# Patient Record
Sex: Female | Born: 1980 | Race: White | Hispanic: No | Marital: Married | Smoking: Never smoker
Health system: Southern US, Community
[De-identification: ages and names within clinical notes are randomized; demographics above are authoritative.]

## PROBLEM LIST (undated history)

## (undated) DIAGNOSIS — O09299 Supervision of pregnancy with other poor reproductive or obstetric history, unspecified trimester: Principal | ICD-10-CM

## (undated) DIAGNOSIS — O0900 Supervision of pregnancy with history of infertility, unspecified trimester: Secondary | ICD-10-CM

## (undated) DIAGNOSIS — R51 Headache: Secondary | ICD-10-CM

## (undated) HISTORY — DX: Headache: R51

## (undated) HISTORY — PX: WISDOM TOOTH EXTRACTION: SHX21

## (undated) HISTORY — DX: Supervision of pregnancy with other poor reproductive or obstetric history, unspecified trimester: O09.299

## (undated) HISTORY — PX: DILATION AND CURETTAGE OF UTERUS: SHX78

## (undated) HISTORY — DX: Supervision of pregnancy with history of infertility, unspecified trimester: O09.00

---

## 2008-11-30 ENCOUNTER — Inpatient Hospital Stay (HOSPITAL_COMMUNITY): Admission: AD | Admit: 2008-11-30 | Discharge: 2008-11-30 | Payer: Self-pay | Admitting: Obstetrics and Gynecology

## 2008-12-01 ENCOUNTER — Ambulatory Visit (HOSPITAL_COMMUNITY): Admission: AD | Admit: 2008-12-01 | Discharge: 2008-12-01 | Payer: Self-pay | Admitting: Obstetrics and Gynecology

## 2009-02-11 ENCOUNTER — Inpatient Hospital Stay (HOSPITAL_COMMUNITY): Admission: AD | Admit: 2009-02-11 | Discharge: 2009-02-13 | Payer: Self-pay | Admitting: Obstetrics and Gynecology

## 2010-07-05 LAB — CBC
HCT: 37.9 % (ref 36.0–46.0)
Hemoglobin: 12.7 g/dL (ref 12.0–15.0)
MCHC: 33.7 g/dL (ref 30.0–36.0)
MCV: 90.9 fL (ref 78.0–100.0)
MCV: 90.9 fL (ref 78.0–100.0)
RBC: 3.9 MIL/uL (ref 3.87–5.11)
RDW: 12.9 % (ref 11.5–15.5)
RDW: 13.3 % (ref 11.5–15.5)

## 2010-07-08 LAB — URINALYSIS, ROUTINE W REFLEX MICROSCOPIC
Glucose, UA: NEGATIVE mg/dL
Hgb urine dipstick: NEGATIVE
Ketones, ur: NEGATIVE mg/dL
Protein, ur: NEGATIVE mg/dL
Urobilinogen, UA: 0.2 mg/dL (ref 0.0–1.0)

## 2011-04-03 NOTE — L&D Delivery Note (Signed)
Delivery Note  I was called to BS secondary of pt w urge to push, she was complete +2 at 1045, pt pushed well over 5-6 ctx   At 11:07 AM a viable female was delivered via Vaginal, Spontaneous Delivery (Presentation: Right Occiput Anterior).  Easy delivery of shoulders, infant w lusty cry and placed on mom's abdomen, cord was clamped and then cut by FOB APGAR: 9, 9; weight 9 lb 15.6 oz (4525 g).   Placenta status: Intact, Spontaneous.  Cord: 3 vessels with the following complications: None.  Cord pH: n/a  Anesthesia: 1% lidocaine   Episiotomy: None Lacerations: 1st degree; R Labial 1 stitch 4-0 monocryl for cosmetic purposes  Suture Repair: 3.0 vicryl rapide Est. Blood Loss (mL): 250  Mom to postpartum.  Baby to nursery-stable. Infant remains skin-skin w pt Pt plans to BF Declines any contraception at present (previous child conceived w clomid)  Dr Normand Sloop notified   Sheila Schwartz 01/16/2012 at 12pm

## 2011-06-21 ENCOUNTER — Encounter (INDEPENDENT_AMBULATORY_CARE_PROVIDER_SITE_OTHER): Payer: BC Managed Care – PPO

## 2011-06-21 DIAGNOSIS — O26849 Uterine size-date discrepancy, unspecified trimester: Secondary | ICD-10-CM

## 2011-06-21 LAB — RPR
RPR: NONREACTIVE
RPR: NONREACTIVE

## 2011-06-21 LAB — CBC
HCT: 39 % (ref 36–46)
HCT: 39 % (ref 36–46)
Hemoglobin: 12.7 g/dL (ref 12.0–16.0)
Platelets: 265 10*3/uL (ref 150–399)
Platelets: 265 10*3/uL (ref 150–399)

## 2011-06-21 LAB — ABO/RH

## 2011-06-21 LAB — RUBELLA ANTIBODY, IGM: Rubella: IMMUNE

## 2011-07-16 ENCOUNTER — Encounter: Payer: Self-pay | Admitting: Obstetrics and Gynecology

## 2011-07-16 ENCOUNTER — Ambulatory Visit (INDEPENDENT_AMBULATORY_CARE_PROVIDER_SITE_OTHER): Payer: BC Managed Care – PPO | Admitting: Obstetrics and Gynecology

## 2011-07-16 VITALS — BP 110/60 | Ht 73.0 in | Wt 200.0 lb

## 2011-07-16 DIAGNOSIS — Z124 Encounter for screening for malignant neoplasm of cervix: Secondary | ICD-10-CM

## 2011-07-16 DIAGNOSIS — Z331 Pregnant state, incidental: Secondary | ICD-10-CM

## 2011-07-16 LAB — OB RESULTS CONSOLE GC/CHLAMYDIA
Chlamydia: NEGATIVE
Gonorrhea: NEGATIVE

## 2011-07-16 NOTE — Progress Notes (Signed)
Pt c/o cramping. 

## 2011-07-16 NOTE — Patient Instructions (Signed)
ABCs of Pregnancy A Antepartum care is very important. Be sure you see your doctor and get prenatal care as soon as you think you are pregnant. At this time, you will be tested for infection, genetic abnormalities and potential problems with you and the pregnancy. This is the time to discuss diet, exercise, work, medications, labor, pain medication during labor and the possibility of a cesarean delivery. Ask any questions that may concern you. It is important to see your doctor regularly throughout your pregnancy. Avoid exposure to toxic substances and chemicals - such as cleaning solvents, lead and mercury, some insecticides, and paint. Pregnant women should avoid exposure to paint fumes, and fumes that cause you to feel ill, dizzy or faint. When possible, it is a good idea to have a pre-pregnancy consultation with your caregiver to begin some important recommendations your caregiver suggests such as, taking folic acid, exercising, quitting smoking, avoiding alcoholic beverages, etc. B Breastfeeding is the healthiest choice for both you and your baby. It has many nutritional benefits for the baby and health benefits for the mother. It also creates a very tight and loving bond between the baby and mother. Talk to your doctor, your family and friends, and your employer about how you choose to feed your baby and how they can support you in your decision. Not all birth defects can be prevented, but a woman can take actions that may increase her chance of having a healthy baby. Many birth defects happen very early in pregnancy, sometimes before a woman even knows she is pregnant. Birth defects or abnormalities of any child in your or the father's family should be discussed with your caregiver. Get a good support bra as your breast size changes. Wear it especially when you exercise and when nursing.  C Celebrate the news of your pregnancy with the your spouse/father and family. Childbirth classes are helpful to  take for you and the spouse/father because it helps to understand what happens during the pregnancy, labor and delivery. Cesarean delivery should be discussed with your doctor so you are prepared for that possibility. The pros and cons of circumcision if it is a boy, should be discussed with your pediatrician. Cigarette smoking during pregnancy can result in low birth weight babies. It has been associated with infertility, miscarriages, tubal pregnancies, infant death (mortality) and poor health (morbidity) in childhood. Additionally, cigarette smoking may cause long-term learning disabilities. If you smoke, you should try to quit before getting pregnant and not smoke during the pregnancy. Secondary smoke may also harm a mother and her developing baby. It is a good idea to ask people to stop smoking around you during your pregnancy and after the baby is born. Extra calcium is necessary when you are pregnant and is found in your prenatal vitamin, in dairy products, green leafy vegetables and in calcium supplements. D A healthy diet according to your current weight and height, along with vitamins and mineral supplements should be discussed with your caregiver. Domestic abuse or violence should be made known to your doctor right away to get the situation corrected. Drink more water when you exercise to keep hydrated. Discomfort of your back and legs usually develops and progresses from the middle of the second trimester through to delivery of the baby. This is because of the enlarging baby and uterus, which may also affect your balance. Do not take illegal drugs. Illegal drugs can seriously harm the baby and you. Drink extra fluids (water is best) throughout pregnancy to help   your body keep up with the increases in your blood volume. Drink at least 6 to 8 glasses of water, fruit juice, or milk each day. A good way to know you are drinking enough fluid is when your urine looks almost like clear water or is very light  yellow.  E Eat healthy to get the nutrients you and your unborn baby need. Your meals should include the five basic food groups. Exercise (30 minutes of light to moderate exercise a day) is important and encouraged during pregnancy, if there are no medical problems or problems with the pregnancy. Exercise that causes discomfort or dizziness should be stopped and reported to your caregiver. Emotions during pregnancy can change from being ecstatic to depression and should be understood by you, your partner and your family. F Fetal screening with ultrasound, amniocentesis and monitoring during pregnancy and labor is common and sometimes necessary. Take 400 micrograms of folic acid daily both before, when possible, and during the first few months of pregnancy to reduce the risk of birth defects of the brain and spine. All women who could possibly become pregnant should take a vitamin with folic acid, every day. It is also important to eat a healthy diet with fortified foods (enriched grain products, including cereals, rice, breads, and pastas) and foods with natural sources of folate (orange juice, green leafy vegetables, beans, peanuts, broccoli, asparagus, peas, and lentils). The father should be involved with all aspects of the pregnancy including, the prenatal care, childbirth classes, labor, delivery, and postpartum time. Fathers may also have emotional concerns about being a father, financial needs, and raising a family. G Genetic testing should be done appropriately. It is important to know your family and the father's history. If there have been problems with pregnancies or birth defects in your family, report these to your doctor. Also, genetic counselors can talk with you about the information you might need in making decisions about having a family. You can call a major medical center in your area for help in finding a board-certified genetic counselor. Genetic testing and counseling should be done  before pregnancy when possible, especially if there is a history of problems in the mother's or father's family. Certain ethnic backgrounds are more at risk for genetic defects. H Get familiar with the hospital where you will be having your baby. Get to know how long it takes to get there, the labor and delivery area, and the hospital procedures. Be sure your medical insurance is accepted there. Get your home ready for the baby including, clothes, the baby's room (when possible), furniture and car seat. Hand washing is important throughout the day, especially after handling raw meat and poultry, changing the baby's diaper or using the bathroom. This can help prevent the spread of many bacteria and viruses that cause infection. Your hair may become dry and thinner, but will return to normal a few weeks after the baby is born. Heartburn is a common problem that can be treated by taking antacids recommended by your caregiver, eating smaller meals 5 or 6 times a day, not drinking liquids when eating, drinking between meals and raising the head of your bed 2 to 3 inches. I Insurance to cover you, the baby, doctor and hospital should be reviewed so that you will be prepared to pay any costs not covered by your insurance plan. If you do not have medical insurance, there are usually clinics and services available for you in your community. Take 30 milligrams of iron during   your pregnancy as prescribed by your doctor to reduce the risk of low red blood cells (anemia) later in pregnancy. All women of childbearing age should eat a diet rich in iron. J There should be a joint effort for the mother, father and any other children to adapt to the pregnancy financially, emotionally, and psychologically during the pregnancy. Join a support group for moms-to-be. Or, join a class on parenting or childbirth. Have the family participate when possible. K Know your limits. Let your caregiver know if you experience any of the  following:   Pain of any kind.   Strong cramps.   You develop a lot of weight in a short period of time (5 pounds in 3 to 5 days).   Vaginal bleeding, leaking of amniotic fluid.   Headache, vision problems.   Dizziness, fainting, shortness of breath.   Chest pain.   Fever of 102 F (38.9 C) or higher.   Gush of clear fluid from your vagina.   Painful urination.   Domestic violence.   Irregular heartbeat (palpitations).   Rapid beating of the heart (tachycardia).   Constant feeling sick to your stomach (nauseous) and vomiting.   Trouble walking, fluid retention (edema).   Muscle weakness.   If your baby has decreased activity.   Persistent diarrhea.   Abnormal vaginal discharge.   Uterine contractions at 20-minute intervals.   Back pain that travels down your leg.  L Learn and practice that what you eat and drink should be in moderation and healthy for you and your baby. Legal drugs such as alcohol and caffeine are important issues for pregnant women. There is no safe amount of alcohol a woman can drink while pregnant. Fetal alcohol syndrome, a disorder characterized by growth retardation, facial abnormalities, and central nervous system dysfunction, is caused by a woman's use of alcohol during pregnancy. Caffeine, found in tea, coffee, soft drinks and chocolate, should also be limited. Be sure to read labels when trying to cut down on caffeine during pregnancy. More than 200 foods, beverages, and over-the-counter medications contain caffeine and have a high salt content! There are coffees and teas that do not contain caffeine. M Medical conditions such as diabetes, epilepsy, and high blood pressure should be treated and kept under control before pregnancy when possible, but especially during pregnancy. Ask your caregiver about any medications that may need to be changed or adjusted during pregnancy. If you are currently taking any medications, ask your caregiver if it  is safe to take them while you are pregnant or before getting pregnant when possible. Also, be sure to discuss any herbs or vitamins you are taking. They are medicines, too! Discuss with your doctor all medications, prescribed and over-the-counter, that you are taking. During your prenatal visit, discuss the medications your doctor may give you during labor and delivery. N Never be afraid to ask your doctor or caregiver questions about your health, the progress of the pregnancy, family problems, stressful situations, and recommendation for a pediatrician, if you do not have one. It is better to take all precautions and discuss any questions or concerns you may have during your office visits. It is a good idea to write down your questions before you visit the doctor. O Over-the-counter cough and cold remedies may contain alcohol or other ingredients that should be avoided during pregnancy. Ask your caregiver about prescription, herbs or over-the-counter medications that you are taking or may consider taking while pregnant.  P Physical activity during pregnancy can   benefit both you and your baby by lessening discomfort and fatigue, providing a sense of well-being, and increasing the likelihood of early recovery after delivery. Light to moderate exercise during pregnancy strengthens the belly (abdominal) and back muscles. This helps improve posture. Practicing yoga, walking, swimming, and cycling on a stationary bicycle are usually safe exercises for pregnant women. Avoid scuba diving, exercise at high altitudes (over 3000 feet), skiing, horseback riding, contact sports, etc. Always check with your doctor before beginning any kind of exercise, especially during pregnancy and especially if you did not exercise before getting pregnant. Q Queasiness, stomach upset and morning sickness are common during pregnancy. Eating a couple of crackers or dry toast before getting out of bed. Foods that you normally love may  make you feel sick to your stomach. You may need to substitute other nutritious foods. Eating 5 or 6 small meals a day instead of 3 large ones may make you feel better. Do not drink with your meals, drink between meals. Questions that you have should be written down and asked during your prenatal visits. R Read about and make plans to baby-proof your home. There are important tips for making your home a safer environment for your baby. Review the tips and make your home safer for you and your baby. Read food labels regarding calories, salt and fat content in the food. S Saunas, hot tubs, and steam rooms should be avoided while you are pregnant. Excessive high heat may be harmful during your pregnancy. Your caregiver will screen and examine you for sexually transmitted diseases and genetic disorders during your prenatal visits. Learn the signs of labor. Sexual relations while pregnant is safe unless there is a medical or pregnancy problem and your caregiver advises against it. T Traveling long distances should be avoided especially in the third trimester of your pregnancy. If you do have to travel out of state, be sure to take a copy of your medical records and medical insurance plan with you. You should not travel long distances without seeing your doctor first. Most airlines will not allow you to travel after 36 weeks of pregnancy. Toxoplasmosis is an infection caused by a parasite that can seriously harm an unborn baby. Avoid eating undercooked meat and handling cat litter. Be sure to wear gloves when gardening. Tingling of the hands and fingers is not unusual and is due to fluid retention. This will go away after the baby is born. U Womb (uterus) size increases during the first trimester. Your kidneys will begin to function more efficiently. This may cause you to feel the need to urinate more often. You may also leak urine when sneezing, coughing or laughing. This is due to the growing uterus pressing  against your bladder, which lies directly in front of and slightly under the uterus during the first few months of pregnancy. If you experience burning along with frequency of urination or bloody urine, be sure to tell your doctor. The size of your uterus in the third trimester may cause a problem with your balance. It is advisable to maintain good posture and avoid wearing high heels during this time. An ultrasound of your baby may be necessary during your pregnancy and is safe for you and your baby. V Vaccinations are an important concern for pregnant women. Get needed vaccines before pregnancy. Center for Disease Control (www.cdc.gov) has clear guidelines for the use of vaccines during pregnancy. Review the list, be sure to discuss it with your doctor. Prenatal vitamins are helpful   and healthy for you and the baby. Do not take extra vitamins except what is recommended. Taking too much of certain vitamins can cause overdose problems. Continuous vomiting should be reported to your caregiver. Varicose veins may appear especially if there is a family history of varicose veins. They should subside after the delivery of the baby. Support hose helps if there is leg discomfort. W Being overweight or underweight during pregnancy may cause problems. Try to get within 15 pounds of your ideal weight before pregnancy. Remember, pregnancy is not a time to be dieting! Do not stop eating or start skipping meals as your weight increases. Both you and your baby need the calories and nutrition you receive from a healthy diet. Be sure to consult with your doctor about your diet. There is a formula and diet plan available depending on whether you are overweight or underweight. Your caregiver or nutritionist can help and advise you if necessary. X Avoid X-rays. If you must have dental work or diagnostic tests, tell your dentist or physician that you are pregnant so that extra care can be taken. X-rays should only be taken when  the risks of not taking them outweigh the risk of taking them. If needed, only the minimum amount of radiation should be used. When X-rays are necessary, protective lead shields should be used to cover areas of the body that are not being X-rayed. Y Your baby loves you. Breastfeeding your baby creates a loving and very close bond between the two of you. Give your baby a healthy environment to live in while you are pregnant. Infants and children require constant care and guidance. Their health and safety should be carefully watched at all times. After the baby is born, rest or take a nap when the baby is sleeping. Z Get your ZZZs. Be sure to get plenty of rest. Resting on your side as often as possible, especially on your left side is advised. It provides the best circulation to your baby and helps reduce swelling. Try taking a nap for 30 to 45 minutes in the afternoon when possible. After the baby is born rest or take a nap when the baby is sleeping. Try elevating your feet for that amount of time when possible. It helps the circulation in your legs and helps reduce swelling.  Most information courtesy of the CDC. Document Released: 03/19/2005 Document Revised: 03/08/2011 Document Reviewed: 12/01/2008 [employer] Patient Information 2012 [employer]. 

## 2011-07-16 NOTE — Progress Notes (Signed)
Wet prep ph 4.5 whiff negative. Wet prep negative

## 2011-07-16 NOTE — Progress Notes (Signed)
Pt here for NEW OB work up.  She is without complaint Physical Examination: General appearance - alert, well appearing, and in no distress Neck - supple, no significant adenopathy Chest - clear to auscultation, no wheezes, rales or rhonchi, symmetric air entry Heart - normal rate, regular rhythm, normal S1, S2, no murmurs, rubs, clicks or gallops Abdomen - soft, nontender, nondistended, no masses or organomegaly Breasts - breasts appear normal, no suspicious masses, no skin or nipple changes or axillary nodes Pelvic - CERVIX: normal appearing cervix without discharge or lesions,cervix long and closed, UTERUS: uterus is 12 size, shape, consistency and nontender, ADNEXA: normal adnexa in size, nontender and no masses Back exam - full range of motion, no tenderness, palpable spasm or pain on motion Neurological - alert, oriented, normal speech, no focal findings or movement disorder noted Musculoskeletal - no joint tenderness, deformity or swelling Extremities - peripheral pulses normal, no pedal edema, no clubbing or cyanosis Skin - normal coloration and turgor, no rashes, no suspicious skin lesions noted A/P Problem list reviewed Genetic testing discussed and pt declined All questions were answered Return to the office in four weeks

## 2011-07-18 LAB — PAP IG, CT-NG, RFX HPV ASCU
Chlamydia Probe Amp: NEGATIVE
GC Probe Amp: NEGATIVE

## 2011-08-13 ENCOUNTER — Ambulatory Visit (INDEPENDENT_AMBULATORY_CARE_PROVIDER_SITE_OTHER): Payer: BC Managed Care – PPO | Admitting: Obstetrics and Gynecology

## 2011-08-13 VITALS — BP 102/62 | Wt 202.0 lb

## 2011-08-13 DIAGNOSIS — Z3480 Encounter for supervision of other normal pregnancy, unspecified trimester: Secondary | ICD-10-CM

## 2011-08-13 NOTE — Progress Notes (Signed)
No complaints RTO in 2wks for anatomy scan Questions answered

## 2011-08-28 ENCOUNTER — Ambulatory Visit (INDEPENDENT_AMBULATORY_CARE_PROVIDER_SITE_OTHER): Payer: BC Managed Care – PPO

## 2011-08-28 ENCOUNTER — Other Ambulatory Visit: Payer: Self-pay | Admitting: Obstetrics and Gynecology

## 2011-08-28 ENCOUNTER — Encounter: Payer: Self-pay | Admitting: Obstetrics and Gynecology

## 2011-08-28 ENCOUNTER — Ambulatory Visit (INDEPENDENT_AMBULATORY_CARE_PROVIDER_SITE_OTHER): Payer: BC Managed Care – PPO | Admitting: Obstetrics and Gynecology

## 2011-08-28 VITALS — BP 102/64 | Wt 204.0 lb

## 2011-08-28 DIAGNOSIS — Z1389 Encounter for screening for other disorder: Secondary | ICD-10-CM

## 2011-08-28 DIAGNOSIS — Z331 Pregnant state, incidental: Secondary | ICD-10-CM

## 2011-08-28 LAB — US OB COMP + 14 WK

## 2011-08-28 NOTE — Progress Notes (Signed)
Pt has no concerns today 

## 2011-08-28 NOTE — Progress Notes (Signed)
Doing well. Ultrasound: Single gestation, normal fluid, normal anatomy, female, anterior placenta, cervix 4.22 cm, 20 weeks (35th percentile). Return to office in 4 weeks. Genetic studies previously declined.

## 2011-09-25 ENCOUNTER — Ambulatory Visit (INDEPENDENT_AMBULATORY_CARE_PROVIDER_SITE_OTHER): Payer: BC Managed Care – PPO | Admitting: Obstetrics and Gynecology

## 2011-09-25 VITALS — BP 120/70 | Wt 212.0 lb

## 2011-09-25 DIAGNOSIS — Z8742 Personal history of other diseases of the female genital tract: Secondary | ICD-10-CM

## 2011-09-25 NOTE — Progress Notes (Signed)
Doing well--some pelvic pressure. Glucola, RPR, Hgb NV. Reviewed EDC of 01/21/12 based on 1st trimester [place].

## 2011-10-25 ENCOUNTER — Ambulatory Visit (INDEPENDENT_AMBULATORY_CARE_PROVIDER_SITE_OTHER): Payer: BC Managed Care – PPO | Admitting: Obstetrics and Gynecology

## 2011-10-25 ENCOUNTER — Other Ambulatory Visit: Payer: BC Managed Care – PPO

## 2011-10-25 ENCOUNTER — Encounter: Payer: Self-pay | Admitting: Obstetrics and Gynecology

## 2011-10-25 VITALS — BP 110/60 | Wt 220.0 lb

## 2011-10-25 DIAGNOSIS — Z331 Pregnant state, incidental: Secondary | ICD-10-CM

## 2011-10-25 DIAGNOSIS — Z3490 Encounter for supervision of normal pregnancy, unspecified, unspecified trimester: Secondary | ICD-10-CM

## 2011-10-25 LAB — CBC
Hemoglobin: 11.3 g/dL — ABNORMAL LOW (ref 12.0–15.0)
MCH: 29.5 pg (ref 26.0–34.0)
MCV: 85.9 fL (ref 78.0–100.0)
RBC: 3.83 MIL/uL — ABNORMAL LOW (ref 3.87–5.11)

## 2011-10-25 NOTE — Progress Notes (Signed)
A/P Glucola, hemoglobin and RPR today Fetal kick counts reviewed All patients questions answered Return in two weeks Continue Prenatal vitamins Blood type o positive Pt with pelvic pain. She declined cervical exam.  He states it is unchanged.

## 2011-10-25 NOTE — Patient Instructions (Signed)
Fetal Movement Counts Patient Name: __________________________________________________ Patient Due Date: ____________________ Kick counts is highly recommended in high risk pregnancies, but it is a good idea for every pregnant woman to do. Start counting fetal movements at 28 weeks of the pregnancy. Fetal movements increase after eating a full meal or eating or drinking something sweet (the blood sugar is higher). It is also important to drink plenty of fluids (well hydrated) before doing the count. Lie on your left side because it helps with the circulation or you can sit in a comfortable chair with your arms over your belly (abdomen) with no distractions around you. DOING THE COUNT  Try to do the count the same time of day each time you do it.   Mark the day and time, then see how long it takes for you to feel 10 movements (kicks, flutters, swishes, rolls). You should have at least 10 movements within 2 hours. You will most likely feel 10 movements in much less than 2 hours. If you do not, wait an hour and count again. After a couple of days you will see a pattern.   What you are looking for is a change in the pattern or not enough counts in 2 hours. Is it taking longer in time to reach 10 movements?  SEEK MEDICAL CARE IF:  You feel less than 10 counts in 2 hours. Tried twice.   No movement in one hour.   The pattern is changing or taking longer each day to reach 10 counts in 2 hours.   You feel the baby is not moving as it usually does.  Date: ____________ Movements: ____________ Start time: ____________ Finish time: ____________  Date: ____________ Movements: ____________ Start time: ____________ Finish time: ____________ Date: ____________ Movements: ____________ Start time: ____________ Finish time: ____________ Date: ____________ Movements: ____________ Start time: ____________ Finish time: ____________ Date: ____________ Movements: ____________ Start time: ____________ Finish time:  ____________ Date: ____________ Movements: ____________ Start time: ____________ Finish time: ____________ Date: ____________ Movements: ____________ Start time: ____________ Finish time: ____________ Date: ____________ Movements: ____________ Start time: ____________ Finish time: ____________  Date: ____________ Movements: ____________ Start time: ____________ Finish time: ____________ Date: ____________ Movements: ____________ Start time: ____________ Finish time: ____________ Date: ____________ Movements: ____________ Start time: ____________ Finish time: ____________ Date: ____________ Movements: ____________ Start time: ____________ Finish time: ____________ Date: ____________ Movements: ____________ Start time: ____________ Finish time: ____________ Date: ____________ Movements: ____________ Start time: ____________ Finish time: ____________ Date: ____________ Movements: ____________ Start time: ____________ Finish time: ____________  Date: ____________ Movements: ____________ Start time: ____________ Finish time: ____________ Date: ____________ Movements: ____________ Start time: ____________ Finish time: ____________ Date: ____________ Movements: ____________ Start time: ____________ Finish time: ____________ Date: ____________ Movements: ____________ Start time: ____________ Finish time: ____________ Date: ____________ Movements: ____________ Start time: ____________ Finish time: ____________ Date: ____________ Movements: ____________ Start time: ____________ Finish time: ____________ Date: ____________ Movements: ____________ Start time: ____________ Finish time: ____________  Date: ____________ Movements: ____________ Start time: ____________ Finish time: ____________ Date: ____________ Movements: ____________ Start time: ____________ Finish time: ____________ Date: ____________ Movements: ____________ Start time: ____________ Finish time: ____________ Date: ____________ Movements:  ____________ Start time: ____________ Finish time: ____________ Date: ____________ Movements: ____________ Start time: ____________ Finish time: ____________ Date: ____________ Movements: ____________ Start time: ____________ Finish time: ____________ Date: ____________ Movements: ____________ Start time: ____________ Finish time: ____________  Date: ____________ Movements: ____________ Start time: ____________ Finish time: ____________ Date: ____________ Movements: ____________ Start time: ____________ Finish time: ____________ Date: ____________ Movements: ____________ Start time:   ____________ Finish time: ____________ Date: ____________ Movements: ____________ Start time: ____________ Finish time: ____________ Date: ____________ Movements: ____________ Start time: ____________ Finish time: ____________ Date: ____________ Movements: ____________ Start time: ____________ Finish time: ____________ Date: ____________ Movements: ____________ Start time: ____________ Finish time: ____________  Date: ____________ Movements: ____________ Start time: ____________ Finish time: ____________ Date: ____________ Movements: ____________ Start time: ____________ Finish time: ____________ Date: ____________ Movements: ____________ Start time: ____________ Finish time: ____________ Date: ____________ Movements: ____________ Start time: ____________ Finish time: ____________ Date: ____________ Movements: ____________ Start time: ____________ Finish time: ____________ Date: ____________ Movements: ____________ Start time: ____________ Finish time: ____________ Date: ____________ Movements: ____________ Start time: ____________ Finish time: ____________  Date: ____________ Movements: ____________ Start time: ____________ Finish time: ____________ Date: ____________ Movements: ____________ Start time: ____________ Finish time: ____________ Date: ____________ Movements: ____________ Start time: ____________ Finish  time: ____________ Date: ____________ Movements: ____________ Start time: ____________ Finish time: ____________ Date: ____________ Movements: ____________ Start time: ____________ Finish time: ____________ Date: ____________ Movements: ____________ Start time: ____________ Finish time: ____________ Date: ____________ Movements: ____________ Start time: ____________ Finish time: ____________  Date: ____________ Movements: ____________ Start time: ____________ Finish time: ____________ Date: ____________ Movements: ____________ Start time: ____________ Finish time: ____________ Date: ____________ Movements: ____________ Start time: ____________ Finish time: ____________ Date: ____________ Movements: ____________ Start time: ____________ Finish time: ____________ Date: ____________ Movements: ____________ Start time: ____________ Finish time: ____________ Date: ____________ Movements: ____________ Start time: ____________ Finish time: ____________ Document Released: 04/18/2006 Document Revised: 03/08/2011 Document Reviewed: 10/19/2008 [employer] Patient Information 2012 [employer]. 

## 2011-10-25 NOTE — Progress Notes (Signed)
Per Dillard patient was recommenced to return in 2 weeks from 10/25/2011 for an ob visit.  At check out patient refused to schedule appointment in 2 wks stating that she waited too long at this visit and a previous visit, causing her to be late for work.  Per pt she rather return in 4 weeks instead.

## 2011-10-27 LAB — GLUCOSE TOLERANCE, 1 HOUR (50G) W/O FASTING: Glucose, 1 Hour GTT: 109 mg/dL (ref 70–140)

## 2011-11-23 ENCOUNTER — Ambulatory Visit (INDEPENDENT_AMBULATORY_CARE_PROVIDER_SITE_OTHER): Payer: BC Managed Care – PPO | Admitting: Obstetrics and Gynecology

## 2011-11-23 ENCOUNTER — Encounter: Payer: Self-pay | Admitting: Obstetrics and Gynecology

## 2011-11-23 VITALS — BP 118/70 | Wt 227.0 lb

## 2011-11-23 DIAGNOSIS — Z331 Pregnant state, incidental: Secondary | ICD-10-CM

## 2011-11-23 NOTE — Progress Notes (Signed)
[redacted]w[redacted]d Glucola normal Weight gain of 37 lbs: diet reviewed

## 2011-11-23 NOTE — Progress Notes (Signed)
Pt would like to know baby's positioning.

## 2011-12-07 ENCOUNTER — Ambulatory Visit (INDEPENDENT_AMBULATORY_CARE_PROVIDER_SITE_OTHER): Payer: BC Managed Care – PPO

## 2011-12-07 VITALS — BP 108/68 | Wt 234.0 lb

## 2011-12-07 DIAGNOSIS — O09299 Supervision of pregnancy with other poor reproductive or obstetric history, unspecified trimester: Secondary | ICD-10-CM

## 2011-12-07 NOTE — Progress Notes (Signed)
[redacted]w[redacted]d.  Displeased w/ wait time to be seen. Pt desires Maternity leave to be effective 01/09/12--pt desires note on letterhead to be mailed to her home address to give to her job. No PTL s/s. GFM.  Watch FH.  Size sl >D today; consider u/s 37 weeks for growth secondary to h/o macrosomia.

## 2011-12-07 NOTE — Progress Notes (Signed)
Pt wants to know baby's positioning and wants to talk about when she should start her leave from work.

## 2011-12-09 DIAGNOSIS — O09299 Supervision of pregnancy with other poor reproductive or obstetric history, unspecified trimester: Secondary | ICD-10-CM

## 2011-12-09 HISTORY — DX: Supervision of pregnancy with other poor reproductive or obstetric history, unspecified trimester: O09.299

## 2011-12-21 ENCOUNTER — Ambulatory Visit (INDEPENDENT_AMBULATORY_CARE_PROVIDER_SITE_OTHER): Payer: BC Managed Care – PPO | Admitting: Obstetrics and Gynecology

## 2011-12-21 ENCOUNTER — Encounter: Payer: Self-pay | Admitting: Obstetrics and Gynecology

## 2011-12-21 VITALS — BP 122/60 | Wt 236.0 lb

## 2011-12-21 DIAGNOSIS — N898 Other specified noninflammatory disorders of vagina: Secondary | ICD-10-CM

## 2011-12-21 DIAGNOSIS — B9689 Other specified bacterial agents as the cause of diseases classified elsewhere: Secondary | ICD-10-CM | POA: Insufficient documentation

## 2011-12-21 DIAGNOSIS — B49 Unspecified mycosis: Secondary | ICD-10-CM

## 2011-12-21 DIAGNOSIS — Z3490 Encounter for supervision of normal pregnancy, unspecified, unspecified trimester: Secondary | ICD-10-CM

## 2011-12-21 DIAGNOSIS — O2340 Unspecified infection of urinary tract in pregnancy, unspecified trimester: Secondary | ICD-10-CM

## 2011-12-21 DIAGNOSIS — Z331 Pregnant state, incidental: Secondary | ICD-10-CM

## 2011-12-21 DIAGNOSIS — N760 Acute vaginitis: Secondary | ICD-10-CM

## 2011-12-21 DIAGNOSIS — B379 Candidiasis, unspecified: Secondary | ICD-10-CM | POA: Insufficient documentation

## 2011-12-21 DIAGNOSIS — O2390 Unspecified genitourinary tract infection in pregnancy, unspecified trimester: Secondary | ICD-10-CM

## 2011-12-21 DIAGNOSIS — N390 Urinary tract infection, site not specified: Secondary | ICD-10-CM

## 2011-12-21 DIAGNOSIS — A499 Bacterial infection, unspecified: Secondary | ICD-10-CM

## 2011-12-21 MED ORDER — METRONIDAZOLE 500 MG PO TABS: 500.0000 mg | ORAL_TABLET | Freq: Two times a day (BID) | ORAL | Status: AC

## 2011-12-21 MED ORDER — FLUCONAZOLE 100 MG PO TABS: 100.0000 mg | ORAL_TABLET | Freq: Every day | ORAL | Status: AC

## 2011-12-21 NOTE — Progress Notes (Signed)
[redacted]w[redacted]d C/o Pressure feeling around urethra and sometimes after voiding. Advised that Urine in Neg to dip testing but have sent the specimen to the lab for culture. Speculum examination: Wet Prep: PH 4.0 Yeast and BV noted  To prescribe -  Diflucan 100 mgs  po x 1                           Flagyl 500mg  po  BID x 7 days FM +  FH to dates [name] in 10 days

## 2011-12-21 NOTE — Progress Notes (Signed)
Pt c/o increased vag pressure feels like UTI sx's

## 2011-12-31 ENCOUNTER — Ambulatory Visit (INDEPENDENT_AMBULATORY_CARE_PROVIDER_SITE_OTHER): Payer: BC Managed Care – PPO | Admitting: Obstetrics and Gynecology

## 2011-12-31 ENCOUNTER — Encounter: Payer: Self-pay | Admitting: Obstetrics and Gynecology

## 2011-12-31 VITALS — BP 122/68 | Wt 239.0 lb

## 2011-12-31 DIAGNOSIS — Z3490 Encounter for supervision of normal pregnancy, unspecified, unspecified trimester: Secondary | ICD-10-CM

## 2011-12-31 DIAGNOSIS — N760 Acute vaginitis: Secondary | ICD-10-CM

## 2011-12-31 DIAGNOSIS — Z331 Pregnant state, incidental: Secondary | ICD-10-CM

## 2011-12-31 DIAGNOSIS — B9689 Other specified bacterial agents as the cause of diseases classified elsewhere: Secondary | ICD-10-CM

## 2011-12-31 DIAGNOSIS — A499 Bacterial infection, unspecified: Secondary | ICD-10-CM

## 2011-12-31 LAB — POCT WET PREP (WET MOUNT): Bacteria Wet Prep HPF POC: NEGATIVE

## 2011-12-31 LAB — OB RESULTS CONSOLE GBS: GBS: NEGATIVE

## 2011-12-31 NOTE — Progress Notes (Signed)
[redacted]w[redacted]d Request cervix check.

## 2011-12-31 NOTE — Progress Notes (Signed)
37w 0d Wet prep for TOC for BV: Wet PreP: 4.0,neg. Normal vaginal secretions FM+ SVE: 0.5/ 10/ -3 [name] x 1 week. Needs GBS culture next visit as missed on this visit and not documented anywhere.

## 2012-01-09 ENCOUNTER — Ambulatory Visit (INDEPENDENT_AMBULATORY_CARE_PROVIDER_SITE_OTHER): Payer: BC Managed Care – PPO | Admitting: Obstetrics and Gynecology

## 2012-01-09 ENCOUNTER — Encounter: Payer: Self-pay | Admitting: Obstetrics and Gynecology

## 2012-01-09 VITALS — BP 102/68 | Wt 237.0 lb

## 2012-01-09 DIAGNOSIS — Z331 Pregnant state, incidental: Secondary | ICD-10-CM

## 2012-01-09 NOTE — Progress Notes (Signed)
Patient ID: Sheila Schwartz, female   DOB: September 27, 1980, 31 y.o.   MRN: [id number] [redacted]w[redacted]d  GBS neg Reviewed s/s uc, srom, vag bleeding, daily fetal kick counts to report, comfort measures. Encouragged 8 water daily and frequent voids. Lavera Guise, CNM

## 2012-01-09 NOTE — Progress Notes (Signed)
[redacted]w[redacted]d no complaints.  Declines cervix check.

## 2012-01-15 ENCOUNTER — Encounter: Payer: Self-pay | Admitting: Obstetrics and Gynecology

## 2012-01-15 ENCOUNTER — Ambulatory Visit (INDEPENDENT_AMBULATORY_CARE_PROVIDER_SITE_OTHER): Payer: BC Managed Care – PPO | Admitting: Obstetrics and Gynecology

## 2012-01-15 VITALS — BP 110/74 | Wt 237.0 lb

## 2012-01-15 DIAGNOSIS — Z3480 Encounter for supervision of other normal pregnancy, unspecified trimester: Secondary | ICD-10-CM

## 2012-01-15 NOTE — Progress Notes (Signed)
Doing well. Cervix 3, 80%, vtx, -1. GBS done today, due to no official results in labs (referenced as negative in 10/9 note from [facility], but I see no results in labs).

## 2012-01-15 NOTE — Progress Notes (Signed)
[name] follow up. Pt states a little more pain/ pressure than before, but nothing she doesn't expect. Sheila Schwartz

## 2012-01-16 ENCOUNTER — Encounter (HOSPITAL_COMMUNITY): Payer: Self-pay

## 2012-01-16 ENCOUNTER — Inpatient Hospital Stay (HOSPITAL_COMMUNITY)
Admission: AD | Admit: 2012-01-16 | Discharge: 2012-01-17 | DRG: 373 | Disposition: A | Discharge status: Home / Self Care | Payer: BC Managed Care – PPO | Source: Ambulatory Visit | Attending: Obstetrics and Gynecology | Admitting: Obstetrics and Gynecology

## 2012-01-16 DIAGNOSIS — D649 Anemia, unspecified: Secondary | ICD-10-CM | POA: Diagnosis not present

## 2012-01-16 DIAGNOSIS — O9903 Anemia complicating the puerperium: Secondary | ICD-10-CM | POA: Diagnosis not present

## 2012-01-16 DIAGNOSIS — O09299 Supervision of pregnancy with other poor reproductive or obstetric history, unspecified trimester: Secondary | ICD-10-CM

## 2012-01-16 DIAGNOSIS — Z8742 Personal history of other diseases of the female genital tract: Secondary | ICD-10-CM

## 2012-01-16 LAB — CBC
MCV: 81.7 fL (ref 78.0–100.0)
Platelets: 250 10*3/uL (ref 150–400)
RBC: 4.1 MIL/uL (ref 3.87–5.11)
WBC: 12.8 10*3/uL — ABNORMAL HIGH (ref 4.0–10.5)

## 2012-01-16 LAB — RPR: RPR Ser Ql: NONREACTIVE

## 2012-01-16 LAB — TYPE AND SCREEN: Antibody Screen: NEGATIVE

## 2012-01-16 MED ORDER — LIDOCAINE HCL (PF) 1 % IJ SOLN
30.0000 mL | INTRAMUSCULAR | Status: DC | PRN
  Administered 2012-01-16: 30 mL via SUBCUTANEOUS
  Filled 2012-01-16: qty 30

## 2012-01-16 MED ORDER — LANOLIN HYDROUS EX OINT: TOPICAL_OINTMENT | CUTANEOUS | Status: DC | PRN

## 2012-01-16 MED ORDER — LACTATED RINGERS IV SOLN: 500.0000 mL | INTRAVENOUS | Status: DC | PRN

## 2012-01-16 MED ORDER — ACETAMINOPHEN 500 MG PO TABS: 1000.0000 mg | ORAL_TABLET | ORAL | Status: DC | PRN

## 2012-01-16 MED ORDER — CITRIC ACID-SODIUM CITRATE 334-500 MG/5ML PO SOLN: 30.0000 mL | ORAL | Status: DC | PRN

## 2012-01-16 MED ORDER — SIMETHICONE 80 MG PO CHEW: 80.0000 mg | CHEWABLE_TABLET | ORAL | Status: DC | PRN

## 2012-01-16 MED ORDER — BENZOCAINE-MENTHOL 20-0.5 % EX AERO
1.0000 "application " | INHALATION_SPRAY | CUTANEOUS | Status: DC | PRN
  Filled 2012-01-16 (×2): qty 56

## 2012-01-16 MED ORDER — WITCH HAZEL-GLYCERIN EX PADS: 1.0000 "application " | MEDICATED_PAD | CUTANEOUS | Status: DC | PRN

## 2012-01-16 MED ORDER — OXYCODONE-ACETAMINOPHEN 5-325 MG PO TABS: 1.0000 | ORAL_TABLET | ORAL | Status: DC | PRN

## 2012-01-16 MED ORDER — LACTATED RINGERS IV SOLN: INTRAVENOUS | Status: DC

## 2012-01-16 MED ORDER — FENTANYL CITRATE 0.05 MG/ML IJ SOLN: 100.0000 ug | INTRAMUSCULAR | Status: DC | PRN

## 2012-01-16 MED ORDER — IBUPROFEN 600 MG PO TABS
600.0000 mg | ORAL_TABLET | Freq: Four times a day (QID) | ORAL | Status: DC
  Administered 2012-01-16 – 2012-01-17 (×4): 600 mg via ORAL
  Filled 2012-01-16 (×4): qty 1

## 2012-01-16 MED ORDER — OXYTOCIN BOLUS FROM INFUSION
500.0000 mL | Freq: Once | INTRAVENOUS | Status: DC
  Filled 2012-01-16: qty 500

## 2012-01-16 MED ORDER — IBUPROFEN 600 MG PO TABS
600.0000 mg | ORAL_TABLET | Freq: Four times a day (QID) | ORAL | Status: DC | PRN
  Administered 2012-01-16: 600 mg via ORAL
  Filled 2012-01-16: qty 1

## 2012-01-16 MED ORDER — MEASLES, MUMPS & RUBELLA VAC ~~LOC~~ INJ
0.5000 mL | INJECTION | Freq: Once | SUBCUTANEOUS | Status: DC
  Filled 2012-01-16: qty 0.5

## 2012-01-16 MED ORDER — DIPHENHYDRAMINE HCL 25 MG PO CAPS: 25.0000 mg | ORAL_CAPSULE | Freq: Four times a day (QID) | ORAL | Status: DC | PRN

## 2012-01-16 MED ORDER — ONDANSETRON HCL 4 MG/2ML IJ SOLN: 4.0000 mg | INTRAMUSCULAR | Status: DC | PRN

## 2012-01-16 MED ORDER — ZOLPIDEM TARTRATE 5 MG PO TABS: 5.0000 mg | ORAL_TABLET | Freq: Every evening | ORAL | Status: DC | PRN

## 2012-01-16 MED ORDER — FLEET ENEMA 7-19 GM/118ML RE ENEM: 1.0000 | ENEMA | Freq: Every day | RECTAL | Status: DC | PRN

## 2012-01-16 MED ORDER — SENNOSIDES-DOCUSATE SODIUM 8.6-50 MG PO TABS
2.0000 | ORAL_TABLET | Freq: Every day | ORAL | Status: DC
  Administered 2012-01-16: 2 via ORAL

## 2012-01-16 MED ORDER — TETANUS-DIPHTH-ACELL PERTUSSIS 5-2.5-18.5 LF-MCG/0.5 IM SUSP
0.5000 mL | Freq: Once | INTRAMUSCULAR | Status: AC
  Administered 2012-01-17: 0.5 mL via INTRAMUSCULAR
  Filled 2012-01-16 (×2): qty 0.5

## 2012-01-16 MED ORDER — ONDANSETRON HCL 4 MG PO TABS: 4.0000 mg | ORAL_TABLET | ORAL | Status: DC | PRN

## 2012-01-16 MED ORDER — OXYTOCIN 40 UNITS IN LACTATED RINGERS INFUSION - SIMPLE MED
62.5000 mL/h | Freq: Once | INTRAVENOUS | Status: AC
  Administered 2012-01-16: 62.5 mL/h via INTRAVENOUS
  Filled 2012-01-16: qty 1000

## 2012-01-16 MED ORDER — PRENATAL MULTIVITAMIN CH
1.0000 | ORAL_TABLET | Freq: Every day | ORAL | Status: DC
  Administered 2012-01-17: 1 via ORAL
  Filled 2012-01-16: qty 1

## 2012-01-16 MED ORDER — BISACODYL 10 MG RE SUPP
10.0000 mg | Freq: Every day | RECTAL | Status: DC | PRN
  Filled 2012-01-16: qty 1

## 2012-01-16 MED ORDER — DIBUCAINE 1 % RE OINT
1.0000 "application " | TOPICAL_OINTMENT | RECTAL | Status: DC | PRN
  Filled 2012-01-16: qty 28

## 2012-01-16 MED ORDER — ONDANSETRON HCL 4 MG/2ML IJ SOLN: 4.0000 mg | Freq: Four times a day (QID) | INTRAMUSCULAR | Status: DC | PRN

## 2012-01-16 NOTE — MAU Note (Signed)
SLillard, CNM notified pt is ruptured, clear fluid, fern positive. RN to check cervix and call pt back with results.

## 2012-01-16 NOTE — MAU Note (Signed)
SLillard, CNM notified of pt's cervical exam, 6/90/-1 vertex. Orders to admit to birthing suites, start iv-LR, obtain cbc and rpr. CNM to place orders for admission.

## 2012-01-16 NOTE — MAU Note (Signed)
Patient states she started leaking clear fluid at 0400, not actively leaking at this time. Contractions are irregular. Reports good fetal movement.

## 2012-01-16 NOTE — MAU Note (Signed)
Pt states water broke at 0400, clear fluid, contractions now q3 minutes apart.

## 2012-01-17 LAB — CBC
HCT: 28.4 % — ABNORMAL LOW (ref 36.0–46.0)
Hemoglobin: 9.3 g/dL — ABNORMAL LOW (ref 12.0–15.0)
MCHC: 32.7 g/dL (ref 30.0–36.0)
MCV: 82.1 fL (ref 78.0–100.0)
RDW: 13.6 % (ref 11.5–15.5)

## 2012-01-17 MED ORDER — IBUPROFEN 600 MG PO TABS: 600.0000 mg | ORAL_TABLET | Freq: Four times a day (QID) | ORAL | Status: DC | PRN

## 2012-01-17 MED ORDER — OXYCODONE-ACETAMINOPHEN 5-325 MG PO TABS: 1.0000 | ORAL_TABLET | ORAL | Status: DC | PRN

## 2012-01-17 NOTE — Progress Notes (Signed)
Post Partum Day 1:S/P SVB Subjective: Doing well--up ad lib without syncope or dizziness.   Feeding:  Breast Contraceptive plan:   Declines at present.  Objective: Blood pressure 118/78, pulse 79, temperature 98 F (36.7 C), temperature source Oral, resp. rate 18, height 6\' 1"  (1.854 m), weight 236 lb 6 oz (107.219 kg), last menstrual period 04/08/2011, SpO2 99.00%, unknown if currently breastfeeding.  Physical Exam:  General: alert Lochia: appropriate Uterine Fundus: firm Incision: healing well DVT Evaluation: No evidence of DVT seen on physical exam. Negative Homan's sign.   Basename 01/17/12 0520 01/16/12 0950  HGB 9.3* 10.9*  HCT 28.4* 33.5*    Assessment/Plan: Interested in d/c today, if OK with peds. Will await GBS result from 10/15 office visit. Will assess later today for possible d/c.   LOS: 1 day   Phat Dalton 01/17/2012, 9:25 AM

## 2012-01-17 NOTE — Progress Notes (Signed)
Post Partum Day 1 Subjective: no complaints, up ad lib, voiding, tolerating PO and Breast-feeding well  Objective: Blood pressure 118/78, pulse 79, temperature 98 F (36.7 C), temperature source Oral, resp. rate 18, height 6\' 1"  (1.854 m), weight 236 lb 6 oz (107.219 kg), last menstrual period 04/08/2011, SpO2 99.00%, unknown if currently breastfeeding.  Physical Exam:  General: no distress Lochia: appropriate Uterine Fundus: firm Incision: NA DVT Evaluation: No evidence of DVT seen on physical exam.   Basename 01/17/12 0520 01/16/12 0950  HGB 9.3* 10.9*  HCT 28.4* 33.5*    Assessment/Plan: Breastfeeding and Contraception Undeclared Postpartum anemia The patient may want to go home later today.  Transfusion discussed and declined.   LOS: 1 day   Sheila Schwartz V 01/17/2012, 9:39 AM

## 2012-01-17 NOTE — Discharge Summary (Signed)
Obstetric Discharge Summary Reason for Admission: onset of labor Prenatal Procedures: NST and ultrasound Intrapartum Procedures: spontaneous vaginal delivery Postpartum Procedures: none Complications-Operative and Postpartum: 1st degree perineal laceration Hemoglobin  Date Value Range Status  01/17/2012 9.3* 12.0 - 15.0 g/dL Final     HCT  Date Value Range Status  01/17/2012 28.4* 36.0 - 46.0 % Final   Hospital Course:  Admitted 01/16/12 with SROM. Pending GBS from office visit 10/15.. Progressed quickly.  No meds for pain management.  Delivery was performed by Sanda Klein, CNM, without complication. Patient and baby tolerated the procedure without difficulty, with  1st degree laceration noted. Infant status was stable and remained in room with mother.  Mother and infant then had an uncomplicated postpartum course, with breast feeding going well. Mom's physical exam was WNL, and she requested early d/c on day 1.  GBS status was still pending at that time, but the pediatrician planned to see the baby on day 2 of life.  The patient therefore was discharged home in stable condition. 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  She received adequate benefit from po pain medications, and was sent home with Ibuprophen and Percocet as needed.      Physical Exam:  General: alert Lochia: appropriate Uterine Fundus: firm Incision: healing well DVT Evaluation: No evidence of DVT seen on physical exam. Negative Homan's sign.  Discharge Diagnoses: Term Pregnancy-delivered  Discharge Information: Date: 01/17/2012 Activity: Per [facility] handout Diet: routine Medications: Ibuprofen and Percocet Condition: Stable Instructions: refer to practice specific booklet Discharge to: home Contraception:  Declines at present--hx infertility. Follow-up Information    Follow up with [facility]. In 6 weeks. (Call to schedule 6 week appt--call with any questions or  concerns.)    Contact information:   [address] [place] [address] [phone]         Newborn Data: Live born female  Birth Weight: 9 lb 15.6 oz (4525 g) APGAR: 9, 9  Home with mother.  Kemet Nijjar 01/17/2012, 1:40 PM

## 2012-01-18 NOTE — H&P (Signed)
Sheila Schwartz is a 31 y.o. female presenting for ROM at about 4am, w more irregular ctx, upon arrival. Denies VB, +FM. Pregnancy has been uncomplicated.   GBS is pending from 10-15, PCR today was sent.   Maternal Medical History:  Reason for admission: Reason for admission: rupture of membranes and contractions.  Contractions: Onset was 1-2 hours ago.   Frequency: irregular.   Perceived severity is moderate.    Fetal activity: Perceived fetal activity is normal.   Last perceived fetal movement was within the past hour.    Prenatal complications: no prenatal complications   OB History    Grav Para Term Preterm Abortions TAB SAB Ect Mult Living   4 3 3  1  1   3      Past Medical History  Diagnosis Date  . Clomid pregnancy     2nd pregnancy  . Headache   . H/O macrosomia in infant in prior pregnancy, currently pregnant 12/09/2011   Past Surgical History  Procedure Date  . Wisdom tooth extraction   . Dilation and curettage of uterus    Family History: family history includes Anemia in her maternal aunt; Cancer in her maternal grandmother; Heart disease in her paternal grandfather; Hypertension in her father; Lung cancer in her paternal grandmother; Migraines in her paternal grandfather; and Thyroid disease in her father. Social History:  reports that she has never smoked. She has never used smokeless tobacco. She reports that she does not drink alcohol or use illicit drugs.   Prenatal Transfer Tool  Maternal Diabetes: No Genetic Screening: Declined Maternal Ultrasounds/Referrals: Normal Fetal Ultrasounds or other Referrals:  None Maternal Substance Abuse:  No Significant Maternal Medications:  None Significant Maternal Lab Results:  None Other Comments:  GBS pending from 10-15  Review of Systems  All other systems reviewed and are negative.    Dilation: 10 Effacement (%): 90 Station: +2 Exam by:: S.Annibelle Brazie,CNM Blood pressure 118/78, pulse 79, temperature 98 F  (36.7 C), temperature source Oral, resp. rate 18, height 6\' 1"  (1.854 m), weight 236 lb 6 oz (107.219 kg), last menstrual period 04/08/2011, SpO2 99.00%, unknown if currently breastfeeding. Maternal Exam:  Uterine Assessment: Contraction strength is moderate.  Contraction duration is 60 seconds. Contraction frequency is regular.   Abdomen: Patient reports no abdominal tenderness. Fundal height is aga.   Estimated fetal weight is 8-9.   Fetal presentation: vertex  Introitus: Normal vulva. Normal vagina.  Amniotic fluid character: clear. Copious clear fluid noted   Pelvis: adequate for delivery.   Cervix: Cervix evaluated by digital exam.     Fetal Exam Fetal Monitor Review: Mode: ultrasound.   Baseline rate: 140.  Variability: moderate (6-25 bpm).   Pattern: accelerations present and no decelerations.    Fetal State Assessment: Category I - tracings are normal.     Physical Exam  Nursing note and vitals reviewed. Constitutional: She is oriented to person, place, and time. She appears well-developed and well-nourished.  HENT:  Head: Normocephalic.  Eyes: Pupils are equal, round, and reactive to light.  Neck: Normal range of motion.  Cardiovascular: Normal rate, regular rhythm and normal heart sounds.   Respiratory: Effort normal and breath sounds normal.  GI: Soft. Bowel sounds are normal.  Genitourinary: Vagina normal.  Musculoskeletal: Normal range of motion.  Neurological: She is alert and oriented to person, place, and time.  Skin: Skin is warm and dry.  Psychiatric: She has a normal mood and affect. Her behavior is normal.    Prenatal labs:  ABO, Rh: --/--/O POS, O POS (10/16 0950) Antibody: NEG (10/16 0950) Rubella: Immune (03/21 0000) RPR: NON REACTIVE (10/16 0950)  HBsAg: Negative (03/21 0000)  HIV: Non-reactive (03/21 0000)  GBS: pending from 10-16  Assessment/Plan: IUP at 38wks FHR reassuring Advanced labor, 2nd stage upon my arrival to bs GBS  pending  Admit to b.s w Dr Normand Sloop attending Routine L&D orders    S.Oneika Simonian, CNM 01/17/12 at 8pm (late entry)

## 2012-01-21 ENCOUNTER — Encounter: Payer: BC Managed Care – PPO | Admitting: Obstetrics and Gynecology

## 2012-02-26 ENCOUNTER — Ambulatory Visit (INDEPENDENT_AMBULATORY_CARE_PROVIDER_SITE_OTHER): Payer: BC Managed Care – PPO | Admitting: Obstetrics and Gynecology

## 2012-02-26 ENCOUNTER — Encounter: Payer: Self-pay | Admitting: Obstetrics and Gynecology

## 2012-02-26 MED ORDER — NORETHINDRONE 0.35 MG PO TABS: 1.0000 | ORAL_TABLET | Freq: Every day | ORAL | Status: DC

## 2012-02-26 NOTE — Progress Notes (Signed)
Sheila Schwartz  is 6 weeks postpartum following a spontaneous vaginal delivery at 40 gestational weeks Date: 01/16/2012 female baby named Porfirio Mylar delivered by [employer].  Breastfeeding: yes Bottlefeeding:  no  Post-partum blues / depression:  no  EPDS score: 4  History of abnormal Pap:  no  Last Pap: Date  07/16/2011 Gestational diabetes:  no  Contraception:  Desires oral contraceptives (estrogen/progesterone)  Normal urinary function:  yes Normal GI function:  yes Returning to work:  Yes /  Need note effective tomorrow 02/27/2012

## 2012-02-26 NOTE — Progress Notes (Signed)
Patient ID: Dashly Meston, female   DOB: [birth year], 31 y.o.   MRN: [id number] Subjective:     Lerae Counce is a 31 y.o. female who presents for a postpartum visit.  I have fully reviewed the prenatal and intrapartum course.    Patient is not sexually active.   The following portions of the patient's history were reviewed and updated as appropriate: allergies, current medications, past family history, past medical history, past social history, past surgical history and problem list.  Review of Systems Pertinent items are noted in HPI.   Objective:    BP 108/72  Ht 6\' 2"  (1.88 m)  Wt 211 lb (95.709 kg)  BMI 27.09 kg/m2  Breastfeeding? Yes  General:  alert, cooperative and no distress     Lungs: clear to auscultation bilaterally  Heart:  regular rate and rhythm, S1, S2 normal, no murmur  Abdomen: soft, non-tender; bowel sounds normal; no masses,  no organomegaly   Vulva:  normal  Vagina: normal vagina  Cervix:  normal  Uterus : normal size, contour, position, consistency, mobility, non-tender  Adnexa:  normal adnexa             Assessment:     Normal postpartum exam.  Pap smear not done at today's visit.  done in April 2013, no hx abnormal, to be done April 2015   Plan:  RTO 1 year or PRN   S.Shonta Phillis, CNM  02/26/2012 12:34 PM

## 2012-04-25 ENCOUNTER — Telehealth: Payer: Self-pay | Admitting: Obstetrics and Gynecology

## 2012-04-25 MED ORDER — NORETHINDRONE 0.35 MG PO TABS: 1.0000 | ORAL_TABLET | Freq: Every day | ORAL | Status: AC

## 2012-04-25 NOTE — Telephone Encounter (Signed)
Rx sent to [facility]. Pt advised   Darien Ramus, CMA

## 2014-02-01 ENCOUNTER — Encounter: Payer: Self-pay | Admitting: Obstetrics and Gynecology

## 2018-07-28 ENCOUNTER — Emergency Department (HOSPITAL_COMMUNITY): Payer: BLUE CROSS/BLUE SHIELD

## 2018-07-28 ENCOUNTER — Other Ambulatory Visit: Payer: Self-pay

## 2018-07-28 ENCOUNTER — Encounter (HOSPITAL_COMMUNITY): Payer: Self-pay

## 2018-07-28 ENCOUNTER — Emergency Department (HOSPITAL_COMMUNITY)
Admission: EM | Admit: 2018-07-28 | Discharge: 2018-07-28 | Disposition: A | Discharge status: Home / Self Care | Payer: BLUE CROSS/BLUE SHIELD | Attending: Emergency Medicine | Admitting: Emergency Medicine

## 2018-07-28 DIAGNOSIS — Z79899 Other long term (current) drug therapy: Secondary | ICD-10-CM | POA: Diagnosis not present

## 2018-07-28 DIAGNOSIS — R140 Abdominal distension (gaseous): Secondary | ICD-10-CM | POA: Insufficient documentation

## 2018-07-28 LAB — LIPASE, BLOOD: Lipase: 25 U/L (ref 11–51)

## 2018-07-28 LAB — CBC
HCT: 38.2 % (ref 36.0–46.0)
Hemoglobin: 12.9 g/dL (ref 12.0–15.0)
MCH: 30.3 pg (ref 26.0–34.0)
MCHC: 33.8 g/dL (ref 30.0–36.0)
MCV: 89.7 fL (ref 80.0–100.0)
Platelets: 256 10*3/uL (ref 150–400)
RBC: 4.26 MIL/uL (ref 3.87–5.11)
RDW: 11.8 % (ref 11.5–15.5)
WBC: 11.1 10*3/uL — ABNORMAL HIGH (ref 4.0–10.5)
nRBC: 0 % (ref 0.0–0.2)

## 2018-07-28 LAB — URINALYSIS, ROUTINE W REFLEX MICROSCOPIC
Bacteria, UA: NONE SEEN
Bilirubin Urine: NEGATIVE
Glucose, UA: NEGATIVE mg/dL
Ketones, ur: NEGATIVE mg/dL
Nitrite: NEGATIVE
Protein, ur: NEGATIVE mg/dL
Specific Gravity, Urine: 1.02 (ref 1.005–1.030)
pH: 5 (ref 5.0–8.0)

## 2018-07-28 LAB — COMPREHENSIVE METABOLIC PANEL
ALT: 11 U/L (ref 0–44)
AST: 10 U/L — ABNORMAL LOW (ref 15–41)
Albumin: 3.9 g/dL (ref 3.5–5.0)
Alkaline Phosphatase: 53 U/L (ref 38–126)
Anion gap: 10 (ref 5–15)
BUN: 9 mg/dL (ref 6–20)
CO2: 27 mmol/L (ref 22–32)
Calcium: 9.2 mg/dL (ref 8.9–10.3)
Chloride: 101 mmol/L (ref 98–111)
Creatinine, Ser: 0.75 mg/dL (ref 0.44–1.00)
GFR calc Af Amer: >60 mL/min (ref >60)
GFR calc non Af Amer: >60 mL/min (ref >60)
Glucose, Bld: 99 mg/dL (ref 70–99)
Potassium: 3.9 mmol/L (ref 3.5–5.1)
Sodium: 138 mmol/L (ref 135–145)
Total Bilirubin: 0.6 mg/dL (ref 0.3–1.2)
Total Protein: 7.1 g/dL (ref 6.5–8.1)

## 2018-07-28 LAB — I-STAT BETA HCG BLOOD, ED (MC, WL, AP ONLY): I-stat hCG, quantitative: <5 m[IU]/mL (ref <5)

## 2018-07-28 MED ORDER — SODIUM CHLORIDE 0.9% FLUSH: 3.0000 mL | Freq: Once | INTRAVENOUS | Status: DC

## 2018-07-28 MED ORDER — POLYETHYLENE GLYCOL 3350 17 GM/SCOOP PO POWD: 1.0000 | Freq: Once | ORAL | 0 refills | Status: AC

## 2018-07-28 NOTE — ED Notes (Signed)
Patient verbalizes understanding of discharge instructions. Opportunity for questioning and answers were provided. Pt discharged from ED. 

## 2018-07-28 NOTE — ED Triage Notes (Signed)
Pt reports abd bloating for the past 4 days, has tried laxatives and and enema without relief. Last BM yesterday. Denies any n/v/d. Tolerating PO well. nad noted.

## 2018-07-28 NOTE — Discharge Instructions (Signed)
Your x-rays did not show any abnormalities.  You are likely constipated.  Today, take 7 capfuls of MiraLAX in 32 ounces of fluid of your choice.  After that, you can take 1-2 capfuls of MiraLAX daily for the next week.  You can continue with your stool softener if you wish.  Drink plenty of water.  High-fiber foods and increased activity also help with regular bowel movements.  Follow-up with a primary care physician or gastroenterologist for reevaluation of your symptoms if they persist.  Return to the emergency department immediately if any concerning signs or symptoms develop such as high fevers, persistent vomiting, or severe abdominal pains.

## 2018-07-28 NOTE — ED Provider Notes (Signed)
[facility] EMERGENCY DEPARTMENT Provider Note   CSN: [id number] Arrival date & time: 07/28/18  1504    History   Chief Complaint Chief Complaint  Patient presents with  . Bloated    HPI Sheila Schwartz is a 38 y.o. female presents today for evaluation of acute onset, persistent abdominal bloating for 4 days.  She reports feeling a generalized pressure sensation to the abdomen but no significant pain.  She does note some discomfort when straining to have a bowel movement or attempting to urinate but denies any urinary symptoms or diarrhea.  She would say that she is constipated compared to her baseline where she has a bowel movement or 2 daily.  Her last bowel movement was yesterday.  She has tried stool softener and an enema with some relief.  She denies nausea, vomiting, chest pain, shortness of breath, fevers, chills, vaginal itching, bleeding, or discharge.  She denies any significant change in her diet.  No suspicious food intake.     The history is provided by the patient.    Past Medical History:  Diagnosis Date  . Clomid pregnancy    2nd pregnancy  . H/O macrosomia in infant in prior pregnancy, currently pregnant 12/09/2011  . TIRWERXV(400.8)     Patient Active Problem List   Diagnosis Date Noted  . LGA (large for gestational age) infant 01/16/2012  . Vaginal delivery 01/16/2012  . Yeast infection 12/21/2011  . BV (bacterial vaginosis) 12/21/2011  . H/O macrosomia in infant in prior pregnancy, currently pregnant 12/09/2011  . Rapid first stage of labor 09/25/2011  . Hx of infertility 09/25/2011    Past Surgical History:  Procedure Laterality Date  . DILATION AND CURETTAGE OF UTERUS    . WISDOM TOOTH EXTRACTION       OB History    Gravida  4   Para  3   Term  3   Preterm      AB  1   Living  3     SAB  1   TAB      Ectopic      Multiple      Live Births  3            Home Medications    Prior to Admission  medications   Medication Sig Start Date End Date Taking? Authorizing Provider  docusate sodium (COLACE) 100 MG capsule Take 100 mg by mouth daily.   Yes [provider]  ibuprofen (ADVIL) 200 MG tablet Take 400 mg by mouth daily as needed for moderate pain.   Yes [provider]  levonorgestrel (MIRENA) 20 MCG/24HR IUD 1 each by Intrauterine route once.   Yes [provider]  ibuprofen (ADVIL,MOTRIN) 600 MG tablet Take 1 tablet (600 mg total) by mouth every 6 (six) hours as needed for pain. Patient not taking: Reported on 07/28/2018 01/17/12   Nigel Bridgeman, CNM  norethindrone (ORTHO MICRONOR) 0.35 MG tablet Take 1 tablet (0.35 mg total) by mouth daily. Patient not taking: Reported on 07/28/2018 04/25/12   Sanda Klein, CNM  oxyCODONE-acetaminophen (PERCOCET/ROXICET) 5-325 MG per tablet Take 1-2 tablets by mouth every 4 (four) hours as needed (moderate - severe pain). Patient not taking: Reported on 07/28/2018 01/17/12   Nigel Bridgeman, CNM  polyethylene glycol powder [facility]) 17 GM/SCOOP powder Take 255 g by mouth once for 1 dose. 07/28/18 07/28/18  Jeanie Sewer, PA-C    Family History Family History  Problem Relation Age of Onset  .  Anemia Maternal Aunt   . Hypertension Father   . Thyroid disease Father   . Cancer Maternal Grandmother   . Lung cancer Paternal Grandmother   . Heart disease Paternal Grandfather   . Migraines Paternal Grandfather     Social History Social History   Tobacco Use  . Smoking status: Never Smoker  . Smokeless tobacco: Never Used  Substance Use Topics  . Alcohol use: No  . Drug use: No     Allergies   Patient has no known allergies.   Review of Systems Review of Systems  Constitutional: Negative for chills and fever.  Respiratory: Negative for cough and shortness of breath.   Cardiovascular: Negative for chest pain.  Gastrointestinal: Positive for constipation. Negative for diarrhea, nausea and vomiting.        +abdominal bloating  Genitourinary: Negative for dysuria, frequency, hematuria, urgency, vaginal bleeding, vaginal discharge and vaginal pain.  All other systems reviewed and are negative.    Physical Exam Updated Vital Signs BP 108/75   Pulse 75   Temp 98.8 F (37.1 C) (Oral)   Resp 16   Ht 6\' 1"  (1.854 m)   Wt 85.3 kg   SpO2 99%   BMI 24.80 kg/m   Physical Exam Vitals signs and nursing note reviewed.  Constitutional:      General: She is not in acute distress.    Appearance: She is well-developed.     Comments: Resting comfortably in bed  HENT:     Head: Normocephalic and atraumatic.  Eyes:     General:        Right eye: No discharge.        Left eye: No discharge.     Conjunctiva/sclera: Conjunctivae normal.  Neck:     Musculoskeletal: Normal range of motion and neck supple.     Vascular: No JVD.     Trachea: No tracheal deviation.  Cardiovascular:     Rate and Rhythm: Normal rate and regular rhythm.     Pulses: Normal pulses.     Heart sounds: Normal heart sounds.  Pulmonary:     Effort: Pulmonary effort is normal.     Breath sounds: Normal breath sounds.  Abdominal:     General: Abdomen is flat. Bowel sounds are normal. There is no distension.     Palpations: Abdomen is soft.     Tenderness: There is no abdominal tenderness. There is no right CVA tenderness, left CVA tenderness, guarding or rebound. Negative signs include Murphy's sign, Rovsing's sign, McBurney's sign and psoas sign.  Skin:    General: Skin is warm and dry.     Findings: No erythema.  Neurological:     Mental Status: She is alert.  Psychiatric:        Behavior: Behavior normal.      ED Treatments / Results  Labs (all labs ordered are listed, but only abnormal results are displayed) Labs Reviewed  COMPREHENSIVE METABOLIC PANEL - Abnormal; Notable for the following components:      Result Value   AST 10 (*)    All other components within normal limits  CBC - Abnormal; Notable for  the following components:   WBC 11.1 (*)    All other components within normal limits  URINALYSIS, ROUTINE W REFLEX MICROSCOPIC - Abnormal; Notable for the following components:   APPearance HAZY (*)    Hgb urine dipstick SMALL (*)    Leukocytes,Ua SMALL (*)    All other components within normal limits  LIPASE,  BLOOD  I-STAT BETA HCG BLOOD, ED ([facility], [facility], [facility] ONLY)    EKG None  Radiology Dg Abdomen 1 View  Result Date: 07/28/2018 CLINICAL DATA:  38 year old female with abdominal bloating EXAM: ABDOMEN - 1 VIEW COMPARISON:  None. FINDINGS: Gas within stomach, small bowel, colon. No abnormal distention. No unexpected radiopaque foreign body, calcification, soft tissue density. IUD within the anatomic pelvis. Pelvic phleboliths. No acute displaced fracture. IMPRESSION: Normal bowel gas pattern. IUD in place Electronically Signed   By: Gilmer Mor D.O.   On: 07/28/2018 18:25    Procedures Procedures (including critical care time)  Medications Ordered in ED Medications  sodium chloride flush (NS) 0.9 % injection 3 mL (has no administration in time range)     Initial Impression / Assessment and Plan / ED Course  I have reviewed the triage vital signs and the nursing notes.  Pertinent labs & imaging results that were available during my care of the patient were reviewed by me and considered in my medical decision making (see chart for details).        Patient presents with complaint of abdominal bloating and constipation for 4 days.  She is afebrile, initially mildly tachycardic on triage but normal heart rate on subsequent reevaluation's and vital signs otherwise entirely normal.  She is nontoxic in appearance, resting comfortably in bed.  Her abdomen is soft and nontender.  No peritoneal signs on examination of the abdomen.  Her lab work reviewed by me is reassuring; she has a mild nonspecific leukocytosis and chart review of previous labs from a few years ago shows this may be  baseline for her.  She exhibits no anemia, no metabolic derangements, no renal insufficiency.  No LFT elevation, lipase within normal limits.  UA does not suggest UTI or nephrolithiasis.  Radiographs of the abdomen show no obstructive bowel gas pattern, IUD in place.  Given reassuring physical examination I doubt acute surgical abdominal pathology including obstruction, perforation, appendicitis, cholecystitis, dissection, TOA, ovarian torsion, or ectopic pregnancy.  No GU complaints, doubt PID.  Suspect that she is somewhat constipated.  Recommend bowel regimen with MiraLAX, fluids, activity.  We discussed the utility of a CT scan I do not feel that one is warranted at this time given her reassuring work-up and physical examination.  She agrees.  No further emergent work-up required at this time.  Stable for discharge home with follow-up with PCP or gastroenterologist for reevaluation of symptoms.  Discussed strict ED return precautions. Pt verbalized understanding of and agreement with plan and is safe for discharge home at this time.  Final Clinical Impressions(s) / ED Diagnoses   Final diagnoses:  Abdominal bloating    ED Discharge Orders         Ordered    polyethylene glycol powder (MIRALAX) 17 GM/SCOOP powder   Once     07/28/18 1848           Jeanie Sewer, PA-C 07/28/18 1903    Melene Plan, DO 07/28/18 2227

## 2018-12-13 ENCOUNTER — Emergency Department (HOSPITAL_COMMUNITY)
Admission: EM | Admit: 2018-12-13 | Discharge: 2018-12-14 | Disposition: A | Discharge status: Home / Self Care | Payer: BC Managed Care – PPO | Attending: Emergency Medicine | Admitting: Emergency Medicine

## 2018-12-13 ENCOUNTER — Other Ambulatory Visit: Payer: Self-pay

## 2018-12-13 ENCOUNTER — Encounter (HOSPITAL_COMMUNITY): Payer: Self-pay | Admitting: Emergency Medicine

## 2018-12-13 DIAGNOSIS — M545 Low back pain, unspecified: Secondary | ICD-10-CM

## 2018-12-13 DIAGNOSIS — Z79899 Other long term (current) drug therapy: Secondary | ICD-10-CM | POA: Insufficient documentation

## 2018-12-13 DIAGNOSIS — M549 Dorsalgia, unspecified: Secondary | ICD-10-CM | POA: Diagnosis present

## 2018-12-13 MED ORDER — OXYCODONE-ACETAMINOPHEN 5-325 MG PO TABS
1.0000 | ORAL_TABLET | Freq: Once | ORAL | Status: AC
  Administered 2018-12-13: 23:00:00 1 via ORAL

## 2018-12-13 MED ORDER — DEXAMETHASONE 4 MG PO TABS
10.0000 mg | ORAL_TABLET | Freq: Once | ORAL | Status: AC
  Administered 2018-12-14: 10 mg via ORAL
  Filled 2018-12-13: qty 3

## 2018-12-13 MED ORDER — METHOCARBAMOL 500 MG PO TABS
750.0000 mg | ORAL_TABLET | Freq: Once | ORAL | Status: AC
  Administered 2018-12-14: 01:00:00 750 mg via ORAL
  Filled 2018-12-13: qty 2

## 2018-12-13 MED ORDER — KETOROLAC TROMETHAMINE 60 MG/2ML IM SOLN
60.0000 mg | Freq: Once | INTRAMUSCULAR | Status: AC
  Administered 2018-12-14: 01:00:00 60 mg via INTRAMUSCULAR
  Filled 2018-12-13: qty 2

## 2018-12-13 MED ORDER — OXYCODONE-ACETAMINOPHEN 5-325 MG PO TABS
1.0000 | ORAL_TABLET | ORAL | Status: DC | PRN
  Administered 2018-12-13: 22:00:00 1 via ORAL
  Filled 2018-12-13 (×2): qty 1

## 2018-12-13 NOTE — ED Provider Notes (Signed)
[facility] EMERGENCY DEPARTMENT Provider Note   CSN: [id number] Arrival date & time: 12/13/18  2123     History   Chief Complaint Chief Complaint  Patient presents with  . Back Pain    HPI Sheila Schwartz is a 38 y.o. female.     38 y/o female presents to the emergency department for evaluation of back pain.  She states that she has a history of similar back pain which became aggravated tonight when she was raking her yard.  She went to bend over and heard a "pop" in her back.  Pain has been severe.  It is aggravated with movement and ambulation.  A constant aching when at rest.  Denies any radiation of her pain, genital or perianal numbness, extremity numbness or paresthesias, bowel or bladder incontinence.  She took ibuprofen prior to arrival without relief and applied a Lidoderm patch.  Has had some improvement in her pain since receiving a tablet of Percocet in triage.  The history is provided by the patient. No language interpreter was used.  Back Pain   Past Medical History:  Diagnosis Date  . Clomid pregnancy    2nd pregnancy  . H/O macrosomia in infant in prior pregnancy, currently pregnant 12/09/2011  . WFUXNATF(573.2)     Patient Active Problem List   Diagnosis Date Noted  . LGA (large for gestational age) infant 01/16/2012  . Vaginal delivery 01/16/2012  . Yeast infection 12/21/2011  . BV (bacterial vaginosis) 12/21/2011  . H/O macrosomia in infant in prior pregnancy, currently pregnant 12/09/2011  . Rapid first stage of labor 09/25/2011  . Hx of infertility 09/25/2011    Past Surgical History:  Procedure Laterality Date  . DILATION AND CURETTAGE OF UTERUS    . WISDOM TOOTH EXTRACTION       OB History    Gravida  4   Para  3   Term  3   Preterm      AB  1   Living  3     SAB  1   TAB      Ectopic      Multiple      Live Births  3            Home Medications    Prior to Admission medications   Medication  Sig Start Date End Date Taking? Authorizing Provider  docusate sodium (COLACE) 100 MG capsule Take 100 mg by mouth daily.    [provider]  HYDROcodone-acetaminophen (NORCO/VICODIN) 5-325 MG tablet Take 1-2 tablets by mouth every 6 (six) hours as needed for severe pain. 12/14/18   Antonietta Breach, PA-C  ibuprofen (ADVIL) 200 MG tablet Take 400 mg by mouth daily as needed for moderate pain.    [provider]  ibuprofen (ADVIL,MOTRIN) 600 MG tablet Take 1 tablet (600 mg total) by mouth every 6 (six) hours as needed for pain. Patient not taking: Reported on 07/28/2018 01/17/12   Donnel Saxon, CNM  levonorgestrel [facility]) 20 MCG/24HR IUD 1 each by Intrauterine route once.    [provider]  methocarbamol (ROBAXIN) 500 MG tablet Take 1 tablet (500 mg total) by mouth every 12 (twelve) hours as needed for muscle spasms. 12/14/18   Antonietta Breach, PA-C  naproxen (NAPROSYN) 500 MG tablet Take 1 tablet (500 mg total) by mouth 2 (two) times daily. 12/14/18   Antonietta Breach, PA-C  norethindrone (ORTHO MICRONOR) 0.35 MG tablet Take 1 tablet (0.35 mg total) by mouth daily. Patient not  taking: Reported on 07/28/2018 04/25/12   Sanda KleinLillard, Shelley, CNM  oxyCODONE-acetaminophen (PERCOCET/ROXICET) 5-325 MG per tablet Take 1-2 tablets by mouth every 4 (four) hours as needed (moderate - severe pain). Patient not taking: Reported on 07/28/2018 01/17/12   Nigel BridgemanLatham, Vicki, CNM    Family History Family History  Problem Relation Age of Onset  . Anemia Maternal Aunt   . Hypertension Father   . Thyroid disease Father   . Cancer Maternal Grandmother   . Lung cancer Paternal Grandmother   . Heart disease Paternal Grandfather   . Migraines Paternal Grandfather     Social History Social History   Tobacco Use  . Smoking status: Never Smoker  . Smokeless tobacco: Never Used  Substance Use Topics  . Alcohol use: No  . Drug use: No     Allergies   Patient has no known allergies.   Review of  Systems Review of Systems  Musculoskeletal: Positive for back pain.  Ten systems reviewed and are negative for acute change, except as noted in the HPI.    Physical Exam Updated Vital Signs BP (!) 109/96 (BP Location: Right Arm)   Pulse 82   Temp 97.7 F (36.5 C) (Oral)   Resp 14   SpO2 100%   Physical Exam Vitals signs and nursing note reviewed.  Constitutional:      General: She is not in acute distress.    Appearance: She is well-developed. She is not diaphoretic.     Comments: Nontoxic appearing and in NAD  HENT:     Head: Normocephalic and atraumatic.  Eyes:     General: No scleral icterus.    Conjunctiva/sclera: Conjunctivae normal.  Neck:     Musculoskeletal: Normal range of motion.  Cardiovascular:     Rate and Rhythm: Normal rate and regular rhythm.     Pulses: Normal pulses.     Comments: DP pulse 2+ in BLE Pulmonary:     Effort: Pulmonary effort is normal. No respiratory distress.     Comments: Respirations even and unlabored Musculoskeletal: Normal range of motion.     Comments: No bony deformities, step-offs, crepitus to the thoracic or lumbosacral midline.  No significant spasm to bilateral lumbar paraspinal muscles.  Negative straight leg raise and crossed straight leg raise.  Skin:    General: Skin is warm and dry.     Coloration: Skin is not pale.     Findings: No erythema or rash.  Neurological:     General: No focal deficit present.     Mental Status: She is alert and oriented to person, place, and time.     Coordination: Coordination normal.     Comments: Sensation to light touch intact in BLE  Psychiatric:        Behavior: Behavior normal.      ED Treatments / Results  Labs (all labs ordered are listed, but only abnormal results are displayed) Labs Reviewed - No data to display  EKG None  Radiology No results found.  Procedures Procedures (including critical care time)  Medications Ordered in ED Medications  ketorolac (TORADOL)  injection 60 mg (60 mg Intramuscular Given 12/14/18 0031)  oxyCODONE-acetaminophen (PERCOCET/ROXICET) 5-325 MG per tablet 1 tablet (1 tablet Oral Given 12/13/18 2323)  methocarbamol (ROBAXIN) tablet 750 mg (750 mg Oral Given 12/14/18 0030)  dexamethasone (DECADRON) tablet 10 mg (10 mg Oral Given 12/14/18 0029)    12:59 AM Patient up and ambulatory with brisk gait without assistance. Pain improved.   Initial Impression /  Assessment and Plan / ED Course  I have reviewed the triage vital signs and the nursing notes.  Pertinent labs & imaging results that were available during my care of the patient were reviewed by me and considered in my medical decision making (see chart for details).        Patient presenting for acute exacerbation of chronic, intermittent back pain.  Patient neurovascularly intact on exam.  Patient can walk but states is painful.  No loss of bowel or bladder control.  No concern for cauda equina.  No fever, hx of CA or hx of IVDU.  Pain and ambulation have improved with symptomatic treatment in the ED.  RICE protocol and pain medicine indicated and discussed with patient.  Encouraged PCP follow up.  Return precautions discussed and provided.  Patient discharged in stable condition with no unaddressed concerns.   Final Clinical Impressions(s) / ED Diagnoses   Final diagnoses:  Acute midline low back pain without sciatica    ED Discharge Orders         Ordered    naproxen (NAPROSYN) 500 MG tablet  2 times daily     12/14/18 0042    methocarbamol (ROBAXIN) 500 MG tablet  Every 12 hours PRN     12/14/18 0042    HYDROcodone-acetaminophen (NORCO/VICODIN) 5-325 MG tablet  Every 6 hours PRN     12/14/18 0042           Antony Madura, PA-C 12/14/18 0101    Nira Conn, MD 12/14/18 [phone]

## 2018-12-13 NOTE — ED Triage Notes (Signed)
Pt reports she was raking in her yard, and when she bent over she hears a pop in her back. She reports severe pain to lower back, difficulty ambulating due to pain, but capable.

## 2018-12-14 MED ORDER — HYDROCODONE-ACETAMINOPHEN 5-325 MG PO TABS: 1.0000 | ORAL_TABLET | Freq: Four times a day (QID) | ORAL | 0 refills | Status: DC | PRN

## 2018-12-14 MED ORDER — METHOCARBAMOL 500 MG PO TABS: 500.0000 mg | ORAL_TABLET | Freq: Two times a day (BID) | ORAL | 0 refills | Status: DC | PRN

## 2018-12-14 MED ORDER — NAPROXEN 500 MG PO TABS: 500.0000 mg | ORAL_TABLET | Freq: Two times a day (BID) | ORAL | 0 refills | Status: DC

## 2018-12-14 NOTE — Discharge Instructions (Signed)
Alternate ice and heat to areas of injury 3-4 times per day to limit inflammation and spasm.  Avoid strenuous activity and heavy lifting.  We recommend consistent use of naproxen in addition to Robaxin for muscle spasms.  You have been prescribed Norco to take as needed for severe pain.  Do not drive or drink alcohol after taking this medication as it may make you drowsy and impair your judgment.  We recommend follow-up with a primary care doctor to ensure resolution of symptoms.  Return to the ED for any new or concerning symptoms. °

## 2018-12-14 NOTE — ED Notes (Signed)
Patient verbalizes understanding of discharge instructions. Opportunity for questioning and answers were provided. Armband removed by staff, pt discharged from ED ambulatory.   

## 2019-08-07 ENCOUNTER — Other Ambulatory Visit: Payer: Self-pay | Admitting: Orthopedic Surgery

## 2019-08-07 DIAGNOSIS — M4726 Other spondylosis with radiculopathy, lumbar region: Secondary | ICD-10-CM

## 2019-08-27 ENCOUNTER — Other Ambulatory Visit: Payer: Self-pay

## 2019-08-27 ENCOUNTER — Ambulatory Visit
Admission: RE | Admit: 2019-08-27 | Discharge: 2019-08-27 | Disposition: A | Discharge status: Home / Self Care | Payer: BC Managed Care – PPO | Source: Ambulatory Visit | Attending: Orthopedic Surgery | Admitting: Orthopedic Surgery

## 2019-08-27 DIAGNOSIS — M4726 Other spondylosis with radiculopathy, lumbar region: Secondary | ICD-10-CM

## 2019-10-28 ENCOUNTER — Other Ambulatory Visit: Payer: Self-pay

## 2019-10-28 ENCOUNTER — Encounter (HOSPITAL_COMMUNITY): Payer: Self-pay | Admitting: Pediatrics

## 2019-10-28 ENCOUNTER — Emergency Department (HOSPITAL_COMMUNITY): Payer: BC Managed Care – PPO

## 2019-10-28 ENCOUNTER — Emergency Department (HOSPITAL_COMMUNITY)
Admission: EM | Admit: 2019-10-28 | Discharge: 2019-10-28 | Disposition: A | Discharge status: Home / Self Care | Payer: BC Managed Care – PPO | Attending: Emergency Medicine | Admitting: Emergency Medicine

## 2019-10-28 DIAGNOSIS — R0602 Shortness of breath: Secondary | ICD-10-CM | POA: Diagnosis not present

## 2019-10-28 DIAGNOSIS — R079 Chest pain, unspecified: Secondary | ICD-10-CM

## 2019-10-28 DIAGNOSIS — R072 Precordial pain: Secondary | ICD-10-CM | POA: Diagnosis present

## 2019-10-28 DIAGNOSIS — R002 Palpitations: Secondary | ICD-10-CM | POA: Insufficient documentation

## 2019-10-28 DIAGNOSIS — Z79899 Other long term (current) drug therapy: Secondary | ICD-10-CM | POA: Insufficient documentation

## 2019-10-28 LAB — BASIC METABOLIC PANEL
Anion gap: 8 (ref 5–15)
BUN: 9 mg/dL (ref 6–20)
CO2: 26 mmol/L (ref 22–32)
Calcium: 9.4 mg/dL (ref 8.9–10.3)
Chloride: 103 mmol/L (ref 98–111)
Creatinine, Ser: 0.76 mg/dL (ref 0.44–1.00)
GFR calc Af Amer: >60 mL/min (ref >60)
GFR calc non Af Amer: >60 mL/min (ref >60)
Glucose, Bld: 102 mg/dL — ABNORMAL HIGH (ref 70–99)
Potassium: 3.9 mmol/L (ref 3.5–5.1)
Sodium: 137 mmol/L (ref 135–145)

## 2019-10-28 LAB — TROPONIN I (HIGH SENSITIVITY)
Troponin I (High Sensitivity): 3 ng/L (ref <18)
Troponin I (High Sensitivity): <2 ng/L (ref <18)

## 2019-10-28 LAB — I-STAT BETA HCG BLOOD, ED (MC, WL, AP ONLY): I-stat hCG, quantitative: <5 m[IU]/mL (ref <5)

## 2019-10-28 LAB — CBC
HCT: 43.8 % (ref 36.0–46.0)
Hemoglobin: 13.9 g/dL (ref 12.0–15.0)
MCH: 28.5 pg (ref 26.0–34.0)
MCHC: 31.7 g/dL (ref 30.0–36.0)
MCV: 89.9 fL (ref 80.0–100.0)
Platelets: 266 10*3/uL (ref 150–400)
RBC: 4.87 MIL/uL (ref 3.87–5.11)
RDW: 12.6 % (ref 11.5–15.5)
WBC: 7.1 10*3/uL (ref 4.0–10.5)
nRBC: 0 % (ref 0.0–0.2)

## 2019-10-28 MED ORDER — SODIUM CHLORIDE 0.9% FLUSH: 3.0000 mL | Freq: Once | INTRAVENOUS | Status: DC

## 2019-10-28 NOTE — Discharge Instructions (Addendum)
Your workup was reassuring today. Please keep appointment with your cardiologist as scheduled for Friday for further evaluation.   Return to the ED IMMEDIATELY for any worsening symptoms including worsening chest pain, new sweating/vomiting/passing out with the chest pain, or any other new/concerning symptoms.

## 2019-10-28 NOTE — ED Provider Notes (Signed)
[facility] EMERGENCY DEPARTMENT Provider Note   CSN: [id number] Arrival date & time: 10/28/19  1249     History Chief Complaint  Patient presents with  . Chest Pain    Sheila Schwartz is a 39 y.o. female who presents to the ED today with complaint of gradual onset, intermittent, sharp, substernal chest pain that began yesterday. Pt reports 2 episodes since; none currently. She reports slight shortness of breath with the chest pain. She does mention that she has also been having palpitations since January intermittently; the chest pain was new which concerned her. She does have an appointment with a cardiologist scheduled for Friday 07/30 to have a holter monitor placed. Pt states that the chest pain was improved with walking around. She did not notice any exacerbating factors. Pt does report she typically exercises on a stationary bike and does not have any chest pain. She denies any hx of CAD. Pt is a never smoker. She does mention her paternal grandfather passed away from an MI at the age of 62; she is unsure regarding her parents. Pt denies hx DVT/PE. She has the Mirena IUD in place. No recent prolonged travel or immobilization. No active malignancy. No hemoptysis. Pt denies diaphoresis, nausea, vomiting, leg swelling, or any other associated symptoms.   The history is provided by the patient and medical records.       Past Medical History:  Diagnosis Date  . Clomid pregnancy    2nd pregnancy  . H/O macrosomia in infant in prior pregnancy, currently pregnant 12/09/2011  . OZHYQMVH(846.9)     Patient Active Problem List   Diagnosis Date Noted  . LGA (large for gestational age) infant 01/16/2012  . Vaginal delivery 01/16/2012  . Yeast infection 12/21/2011  . BV (bacterial vaginosis) 12/21/2011  . H/O macrosomia in infant in prior pregnancy, currently pregnant 12/09/2011  . Rapid first stage of labor 09/25/2011  . Hx of infertility 09/25/2011    Past  Surgical History:  Procedure Laterality Date  . DILATION AND CURETTAGE OF UTERUS    . WISDOM TOOTH EXTRACTION       OB History    Gravida  4   Para  3   Term  3   Preterm      AB  1   Living  3     SAB  1   TAB      Ectopic      Multiple      Live Births  3           Family History  Problem Relation Age of Onset  . Anemia Maternal Aunt   . Hypertension Father   . Thyroid disease Father   . Cancer Maternal Grandmother   . Lung cancer Paternal Grandmother   . Heart disease Paternal Grandfather   . Migraines Paternal Grandfather     Social History   Tobacco Use  . Smoking status: Never Smoker  . Smokeless tobacco: Never Used  Substance Use Topics  . Alcohol use: No  . Drug use: No    Home Medications Prior to Admission medications   Medication Sig Start Date End Date Taking? Authorizing Provider  levonorgestrel (MIRENA) 20 MCG/24HR IUD 1 each by Intrauterine route once.   Yes [provider]  HYDROcodone-acetaminophen (NORCO/VICODIN) 5-325 MG tablet Take 1-2 tablets by mouth every 6 (six) hours as needed for severe pain. Patient not taking: Reported on 10/28/2019 12/14/18   Antony Madura, PA-C  ibuprofen (ADVIL,MOTRIN) 600 MG  tablet Take 1 tablet (600 mg total) by mouth every 6 (six) hours as needed for pain. Patient not taking: Reported on 07/28/2018 01/17/12   Nigel Bridgeman, CNM  methocarbamol (ROBAXIN) 500 MG tablet Take 1 tablet (500 mg total) by mouth every 12 (twelve) hours as needed for muscle spasms. Patient not taking: Reported on 10/28/2019 12/14/18   Antony Madura, PA-C  naproxen (NAPROSYN) 500 MG tablet Take 1 tablet (500 mg total) by mouth 2 (two) times daily. Patient not taking: Reported on 10/28/2019 12/14/18   Antony Madura, PA-C  norethindrone (ORTHO MICRONOR) 0.35 MG tablet Take 1 tablet (0.35 mg total) by mouth daily. Patient not taking: Reported on 07/28/2018 04/25/12   Sanda Klein, CNM  oxyCODONE-acetaminophen  (PERCOCET/ROXICET) 5-325 MG per tablet Take 1-2 tablets by mouth every 4 (four) hours as needed (moderate - severe pain). Patient not taking: Reported on 07/28/2018 01/17/12   Nigel Bridgeman, CNM    Allergies    Patient has no known allergies.  Review of Systems   Review of Systems  Constitutional: Negative for chills, diaphoresis and fever.  Respiratory: Positive for shortness of breath.   Cardiovascular: Positive for chest pain and palpitations. Negative for leg swelling.  Gastrointestinal: Negative for nausea and vomiting.  All other systems reviewed and are negative.   Physical Exam Updated Vital Signs BP 111/80 (BP Location: Right Arm)   Pulse 88   Temp 97.9 F (36.6 C) (Oral)   Resp 16   SpO2 100%   Physical Exam Vitals and nursing note reviewed.  Constitutional:      Appearance: She is not ill-appearing or diaphoretic.  HENT:     Head: Normocephalic and atraumatic.  Eyes:     Conjunctiva/sclera: Conjunctivae normal.  Cardiovascular:     Rate and Rhythm: Normal rate and regular rhythm.     Pulses:          Radial pulses are 2+ on the right side and 2+ on the left side.       Dorsalis pedis pulses are 2+ on the right side and 2+ on the left side.     Heart sounds: Normal heart sounds.  Pulmonary:     Effort: Pulmonary effort is normal.     Breath sounds: Normal breath sounds. No decreased breath sounds, wheezing, rhonchi or rales.  Chest:     Chest wall: No tenderness.  Abdominal:     Palpations: Abdomen is soft.     Tenderness: There is no abdominal tenderness. There is no guarding or rebound.  Musculoskeletal:     Cervical back: Neck supple.  Skin:    General: Skin is warm and dry.  Neurological:     Mental Status: She is alert.     ED Results / Procedures / Treatments   Labs (all labs ordered are listed, but only abnormal results are displayed) Labs Reviewed  BASIC METABOLIC PANEL - Abnormal; Notable for the following components:      Result Value    Glucose, Bld 102 (*)    All other components within normal limits  CBC  I-STAT BETA HCG BLOOD, ED ([facility], [facility], [facility] ONLY)  TROPONIN I (HIGH SENSITIVITY)  TROPONIN I (HIGH SENSITIVITY)    EKG None  Radiology DG Chest 2 View  Result Date: 10/28/2019 CLINICAL DATA:  Chest pain and tightness since yesterday. EXAM: CHEST - 2 VIEW COMPARISON:  None. FINDINGS: Heart size is normal. Mediastinal shadows are normal. The lungs are clear. No bronchial thickening. No infiltrate, mass, effusion or collapse.  Pulmonary vascularity is normal. No bony abnormality. Overlying artifact. IMPRESSION: Normal chest Electronically Signed   By: Paulina Fusi M.D.   On: 10/28/2019 13:29    Procedures Procedures (including critical care time)  Medications Ordered in ED Medications  sodium chloride flush (NS) 0.9 % injection 3 mL (has no administration in time range)    ED Course  I have reviewed the triage vital signs and the nursing notes.  Pertinent labs & imaging results that were available during my care of the patient were reviewed by me and considered in my medical decision making (see chart for details).    MDM Rules/Calculators/A&P                          39 year old female who presents to the ED today with intermittent chest pain substernally for the past 2 days.  Has had intermittent palpitations for the past several months, plans to see cardiology on Friday for holter monitor. On arrival to the ED pt is afebrile, nontachycardic, and nontachypneic. Labwork was initiated while she was in the [facility] - EKG without ischemic changes. CXR clear. Troponin < 2. CBC and BMP without acute findings. Beta hcg negative. Pt brought back to a room once her work up complete. Pt with mirena IUD - she is PERC negative. She is not having any active CP. Work up negative at this time; will discharge home with instructions to keep appt with cards on Friday. Pt instructed to return to the ED for any worsening sx. She is in agreement  with plan and stable for discharge home.   This note was prepared using Dragon voice recognition software and may include unintentional dictation errors due to the inherent limitations of voice recognition software.  Final Clinical Impression(s) / ED Diagnoses Final diagnoses:  Nonspecific chest pain    Rx / DC Orders ED Discharge Orders    None       Discharge Instructions     Your workup was reassuring today. Please keep appointment with your cardiologist as scheduled for Friday for further evaluation.   Return to the ED IMMEDIATELY for any worsening symptoms including worsening chest pain, new sweating/vomiting/passing out with the chest pain, or any other new/concerning symptoms.        Tanda Rockers, PA-C 10/28/19 2209    Geoffery Lyons, MD 10/29/19 [phone]

## 2019-10-28 NOTE — ED Triage Notes (Signed)
C/O chest pain started yesterday; along w/ chest tightness relieved by movement. Denies PMH.

## 2020-05-02 IMAGING — CR ABDOMEN - 1 VIEW
1 series · 1 of 1 positions shown · non-contrast
Comparison: None.

CLINICAL DATA: 38-year-old female with abdominal bloating

EXAM:
ABDOMEN - 1 VIEW

[abdomen kub]
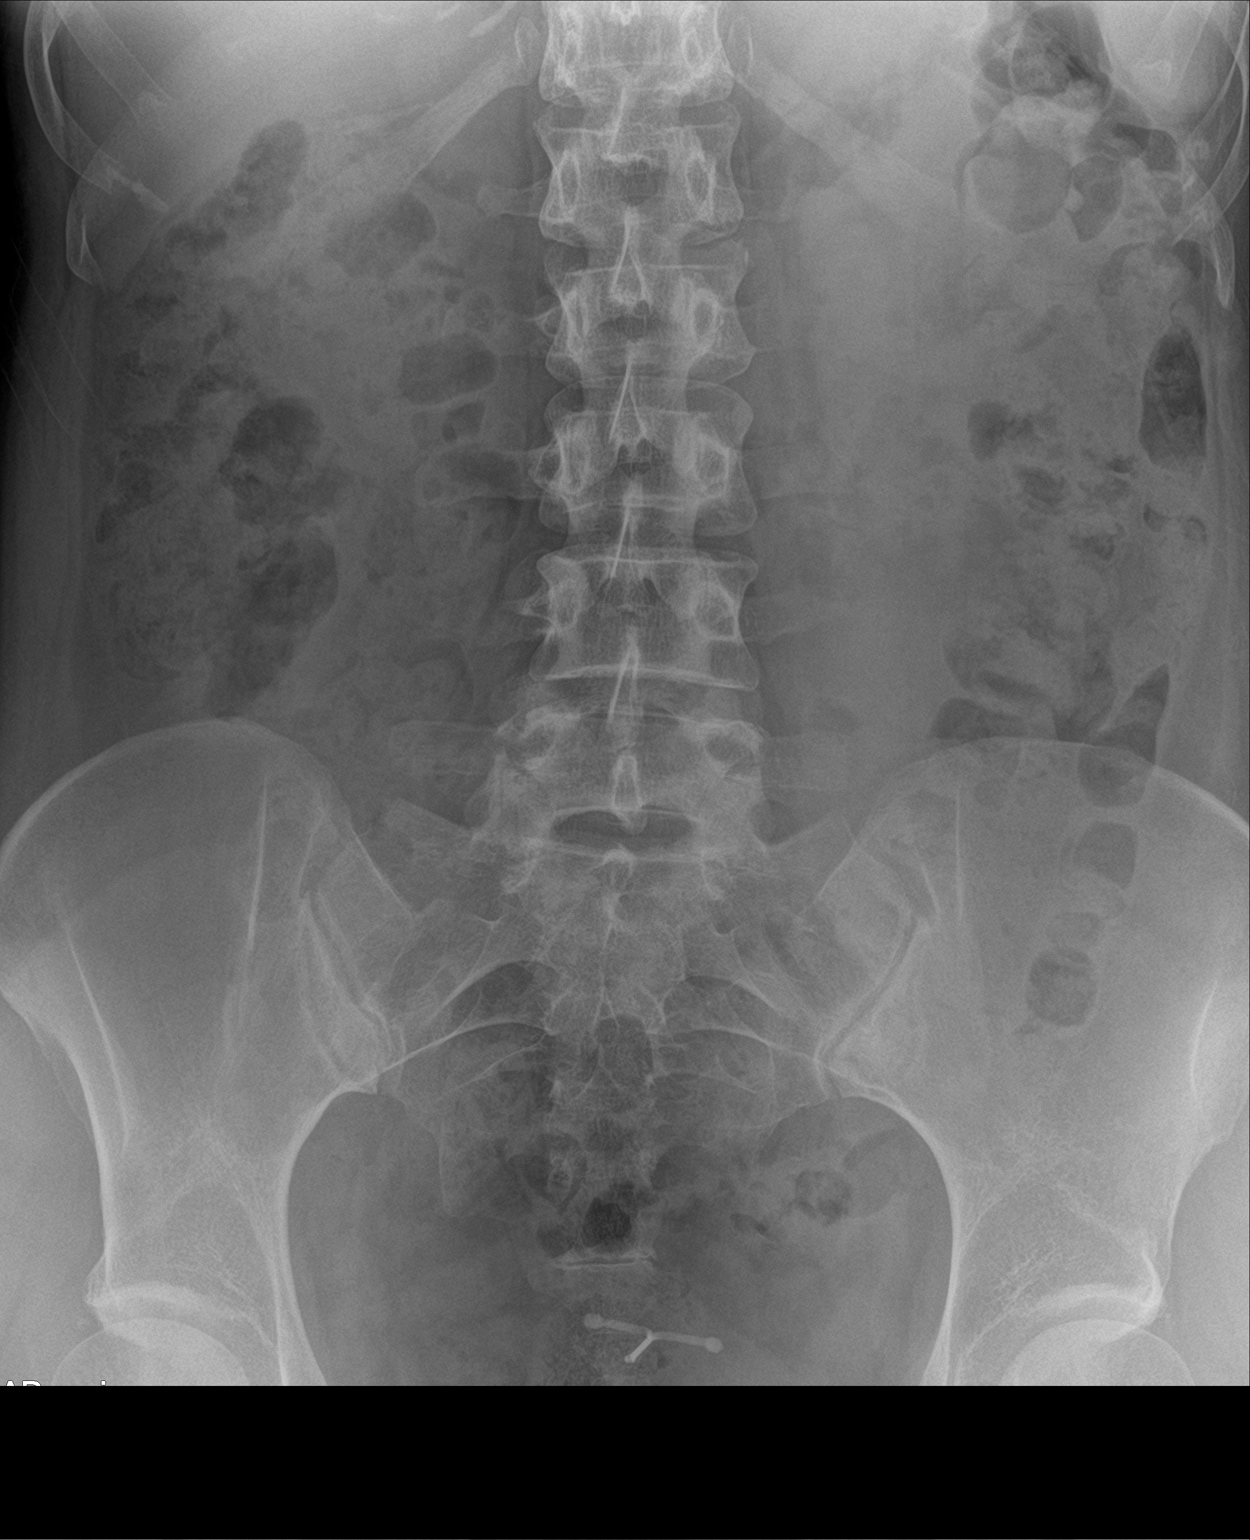

[1 of 1 positions shown; findings below may reference images not displayed]

FINDINGS: Gas within stomach, small bowel, colon. No abnormal distention. No
unexpected radiopaque foreign body, calcification, soft tissue
density.

IUD within the anatomic pelvis.

Pelvic phleboliths.

No acute displaced fracture.
IMPRESSION: Normal bowel gas pattern.

IUD in place

## 2020-08-15 ENCOUNTER — Other Ambulatory Visit (HOSPITAL_COMMUNITY): Payer: Self-pay

## 2021-06-01 IMAGING — MR MR LUMBAR SPINE W/O CM
5 series · 48 of 48 positions shown · non-contrast
Comparison: Abdominal radiograph dated 07/28/2018

CLINICAL DATA: Low back pain and bilateral buttock pain, right more
than left.

EXAM:
MRI LUMBAR SPINE WITHOUT CONTRAST
TECHNIQUE: Multiplanar, multisequence MR imaging of the lumbar spine was
performed. No intravenous contrast was administered.

[Series 3: T2 post-contrast · sagittal · 4.0mm · 0.88mm/px · 4 of 12 slices shown]
[im 1/12]
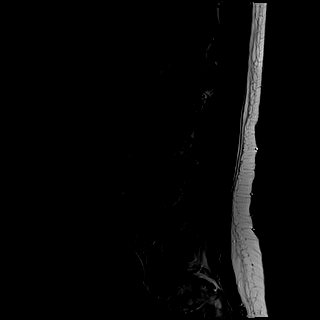
[im 4/12]
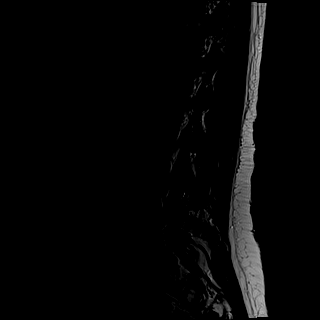
[im 8/12]
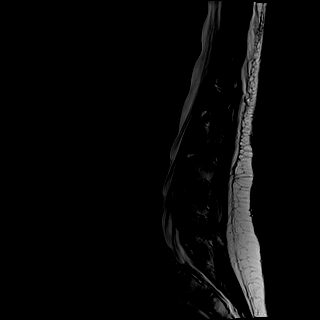
[im 12/12]
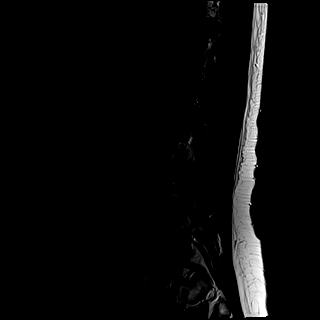

[Series 4: T1 · sagittal · 4.0mm · 0.88mm/px · 5 of 12 slices shown (1 of 2)]
[im 1/12]
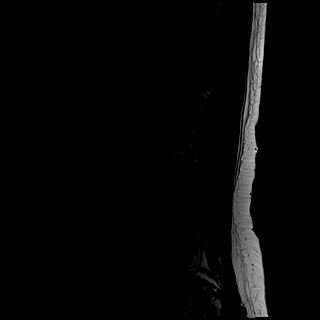
[im 3/12]
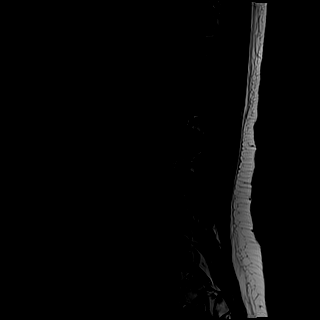
[im 6/12]
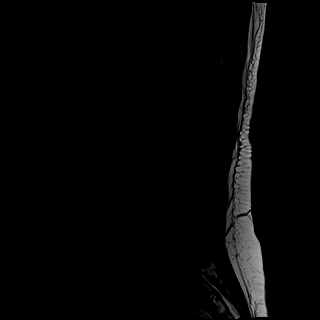
[im 9/12]
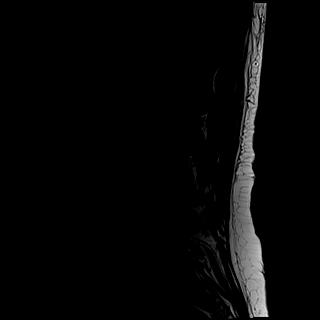
[im 12/12]
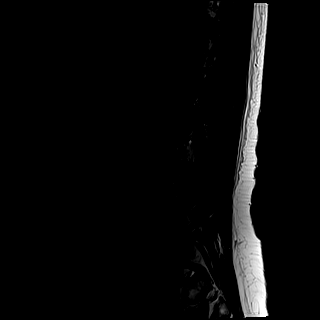

[Series 5: tirm sag · sagittal · 4.0mm · 0.55mm/px · 5 of 12 slices shown]
[im 1/12]
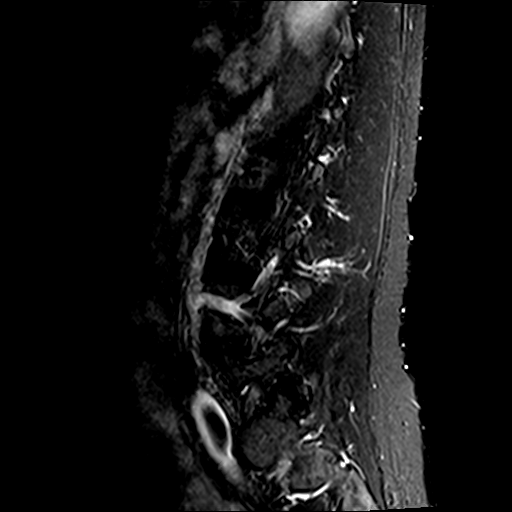
[im 3/12]
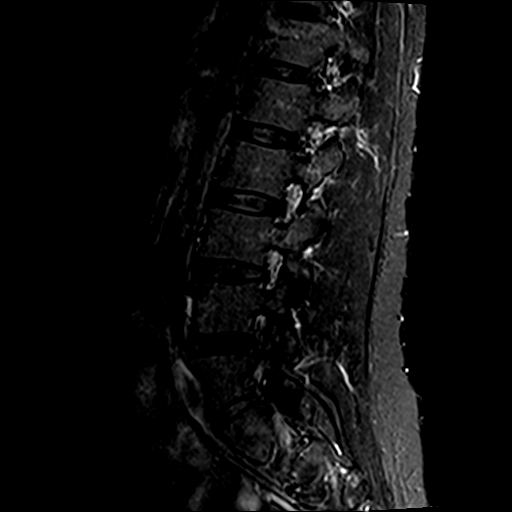
[im 6/12]
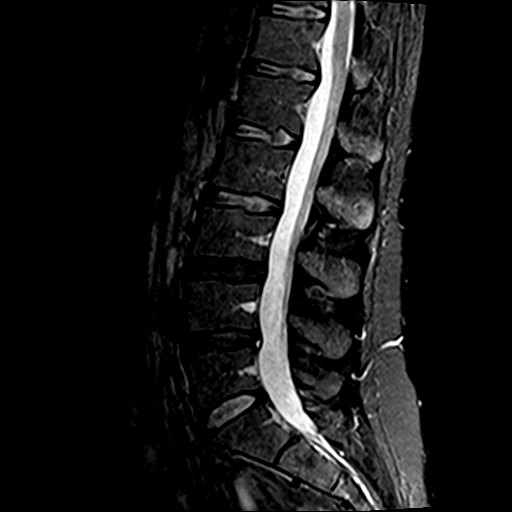
[im 9/12]
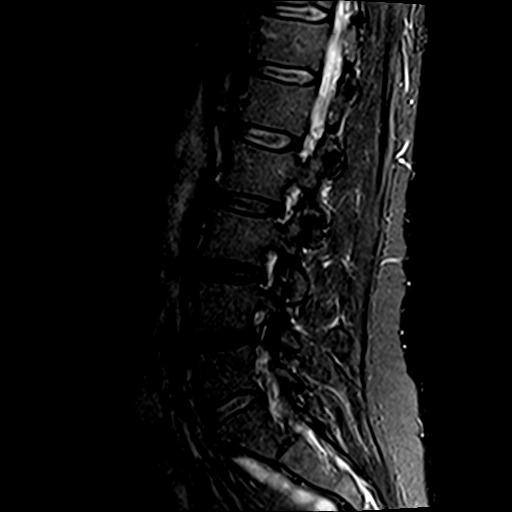
[im 12/12]
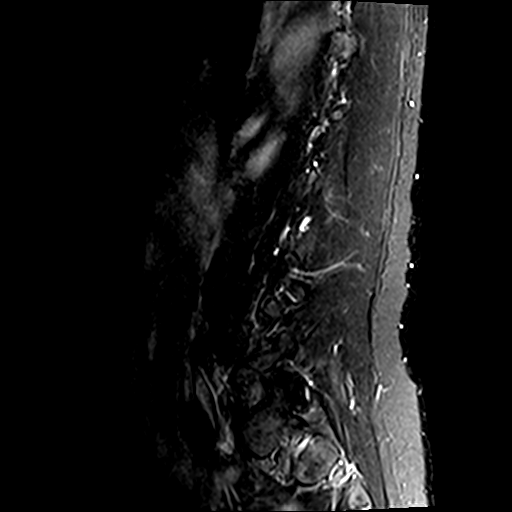

[Series 6: T1 · axial · 4.0mm · 0.78mm/px · z∈[-86,+148]mm · 17 of 40 slices shown (2 of 2)]
[im 1/40]
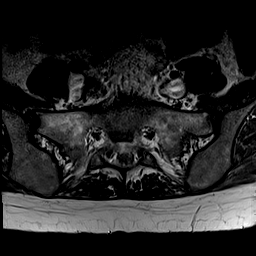
[im 3/40]
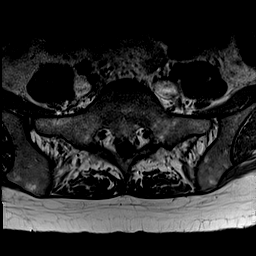
[im 5/40]
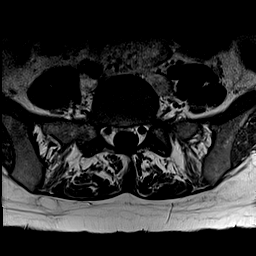
[im 8/40]
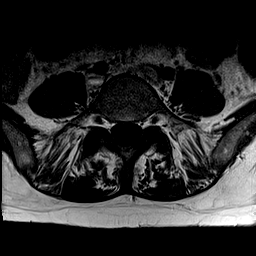
[im 10/40]
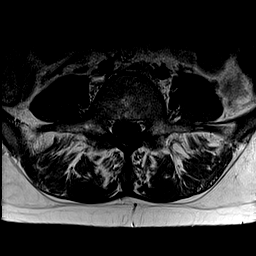
[im 13/40]
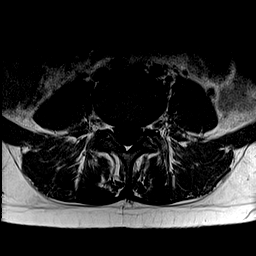
[im 15/40]
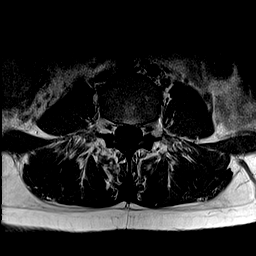
[im 18/40]
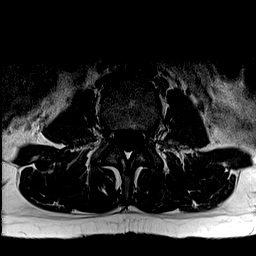
[im 20/40]
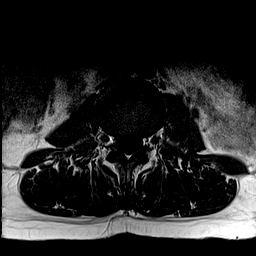
[im 22/40]
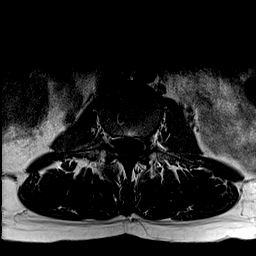
[im 25/40]
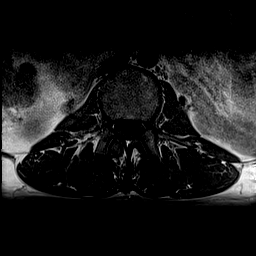
[im 27/40]
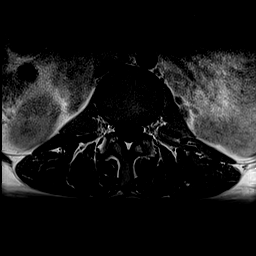
[im 30/40]
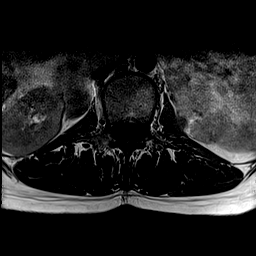
[im 32/40]
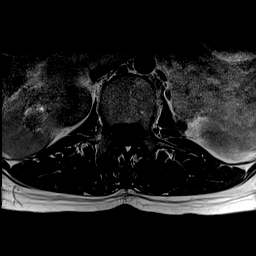
[im 35/40]
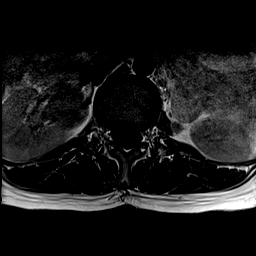
[im 37/40]
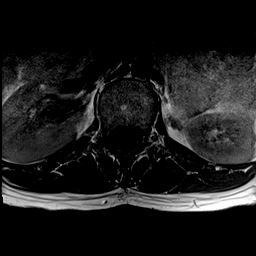
[im 40/40]
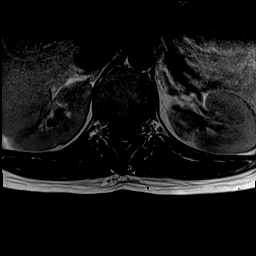

[Series 7: T2 · axial · 4.0mm · 1.04mm/px · z∈[-86,+148]mm · 17 of 40 slices shown]
[im 1/40]
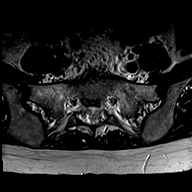
[im 3/40]
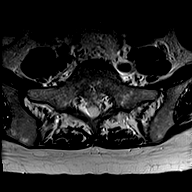
[im 5/40]
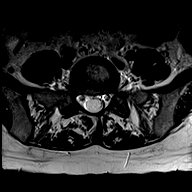
[im 8/40]
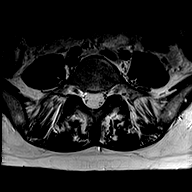
[im 10/40]
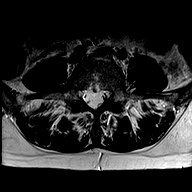
[im 13/40]
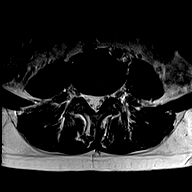
[im 15/40]
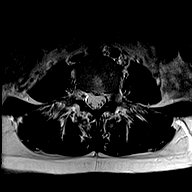
[im 18/40]
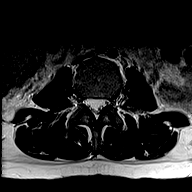
[im 20/40]
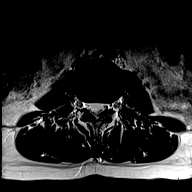
[im 22/40]
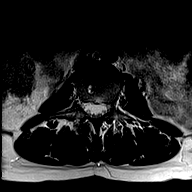
[im 25/40]
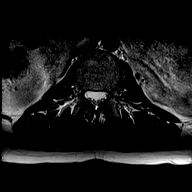
[im 27/40]
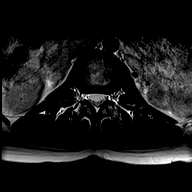
[im 30/40]
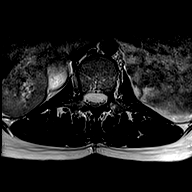
[im 32/40]
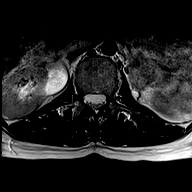
[im 35/40]
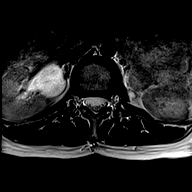
[im 37/40]
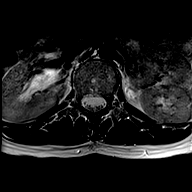
[im 40/40]
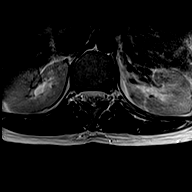

[48 of 48 positions shown; findings below may reference images not displayed]

FINDINGS: Segmentation:  5 lumbar type vertebra.

Alignment:  Physiologic.

Vertebrae: No fracture, evidence of discitis, or bone lesion. No
spinal or foraminal stenoses. Minimal facet arthritis at L5-S1. The
facet joints of the remainder of the lumbar spine are normal.

Conus medullaris and cauda equina: Conus extends to the T12-L1
level. Conus and cauda equina appear normal.

Paraspinal and other soft tissues: Negative.

Disc levels:

T12-L1: Tiny broad-based disc bulge with no neural impingement.
Otherwise negative.

L1-2: Normal.

L2-3: Normal.

L3-4: Slight disc desiccation.  Otherwise normal.

L4-5: Slight disc desiccation. Small central annular fissure and
tiny central disc bulge without neural impingement. Otherwise
normal.

L5-S1: Normal disc. Minimal degenerative changes of the facet
joints.
IMPRESSION: 1. Small central annular fissure and tiny central disc bulge at L4-5
without neural impingement.
2. Minimal degenerative changes of the facet joints at L5-S1.

## 2021-08-02 IMAGING — DX DG CHEST 2V
2 series · 2 of 2 positions shown · non-contrast
Comparison: None.

CLINICAL DATA: Chest pain and tightness since yesterday.

EXAM:
CHEST - 2 VIEW

[w chest pa]
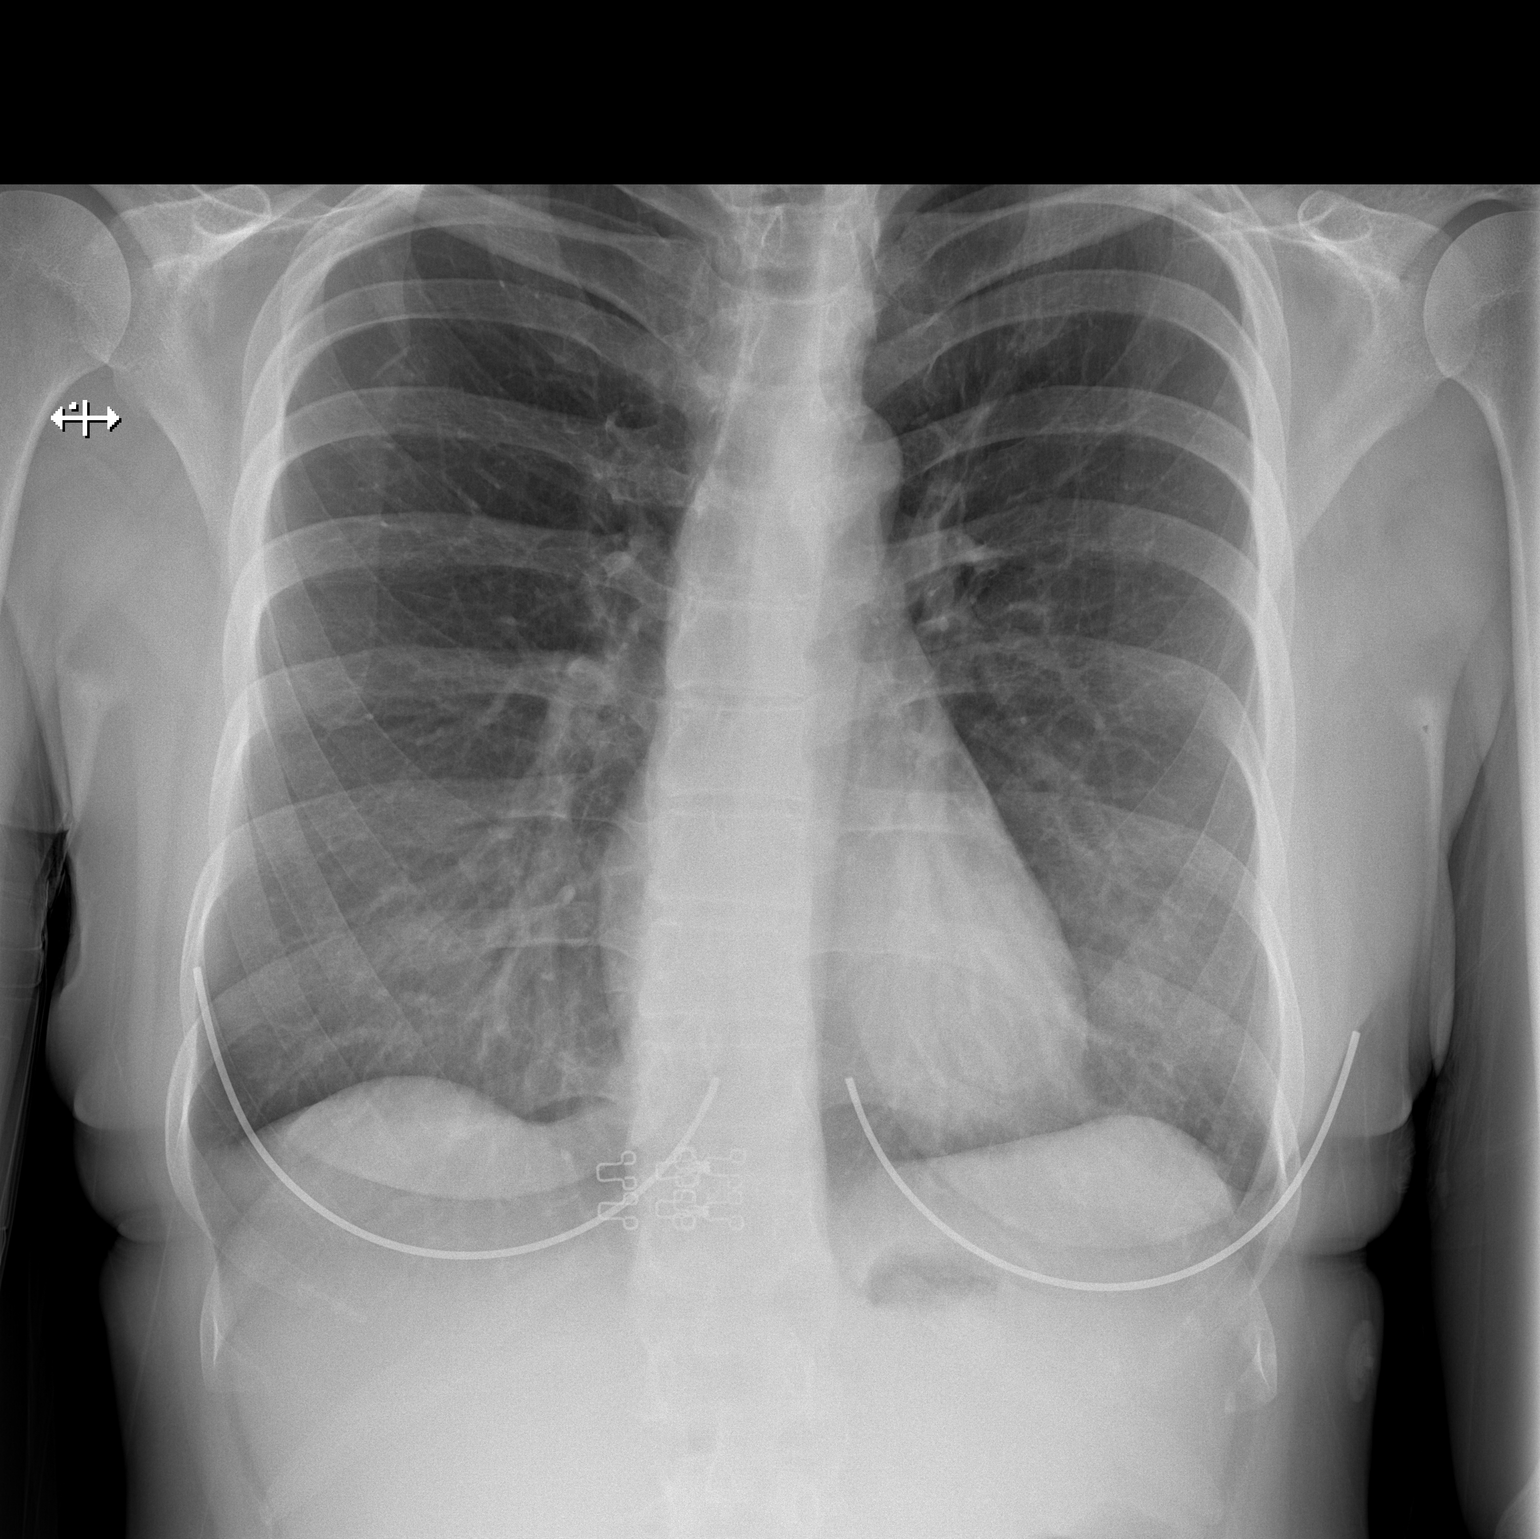

[w chest lat]
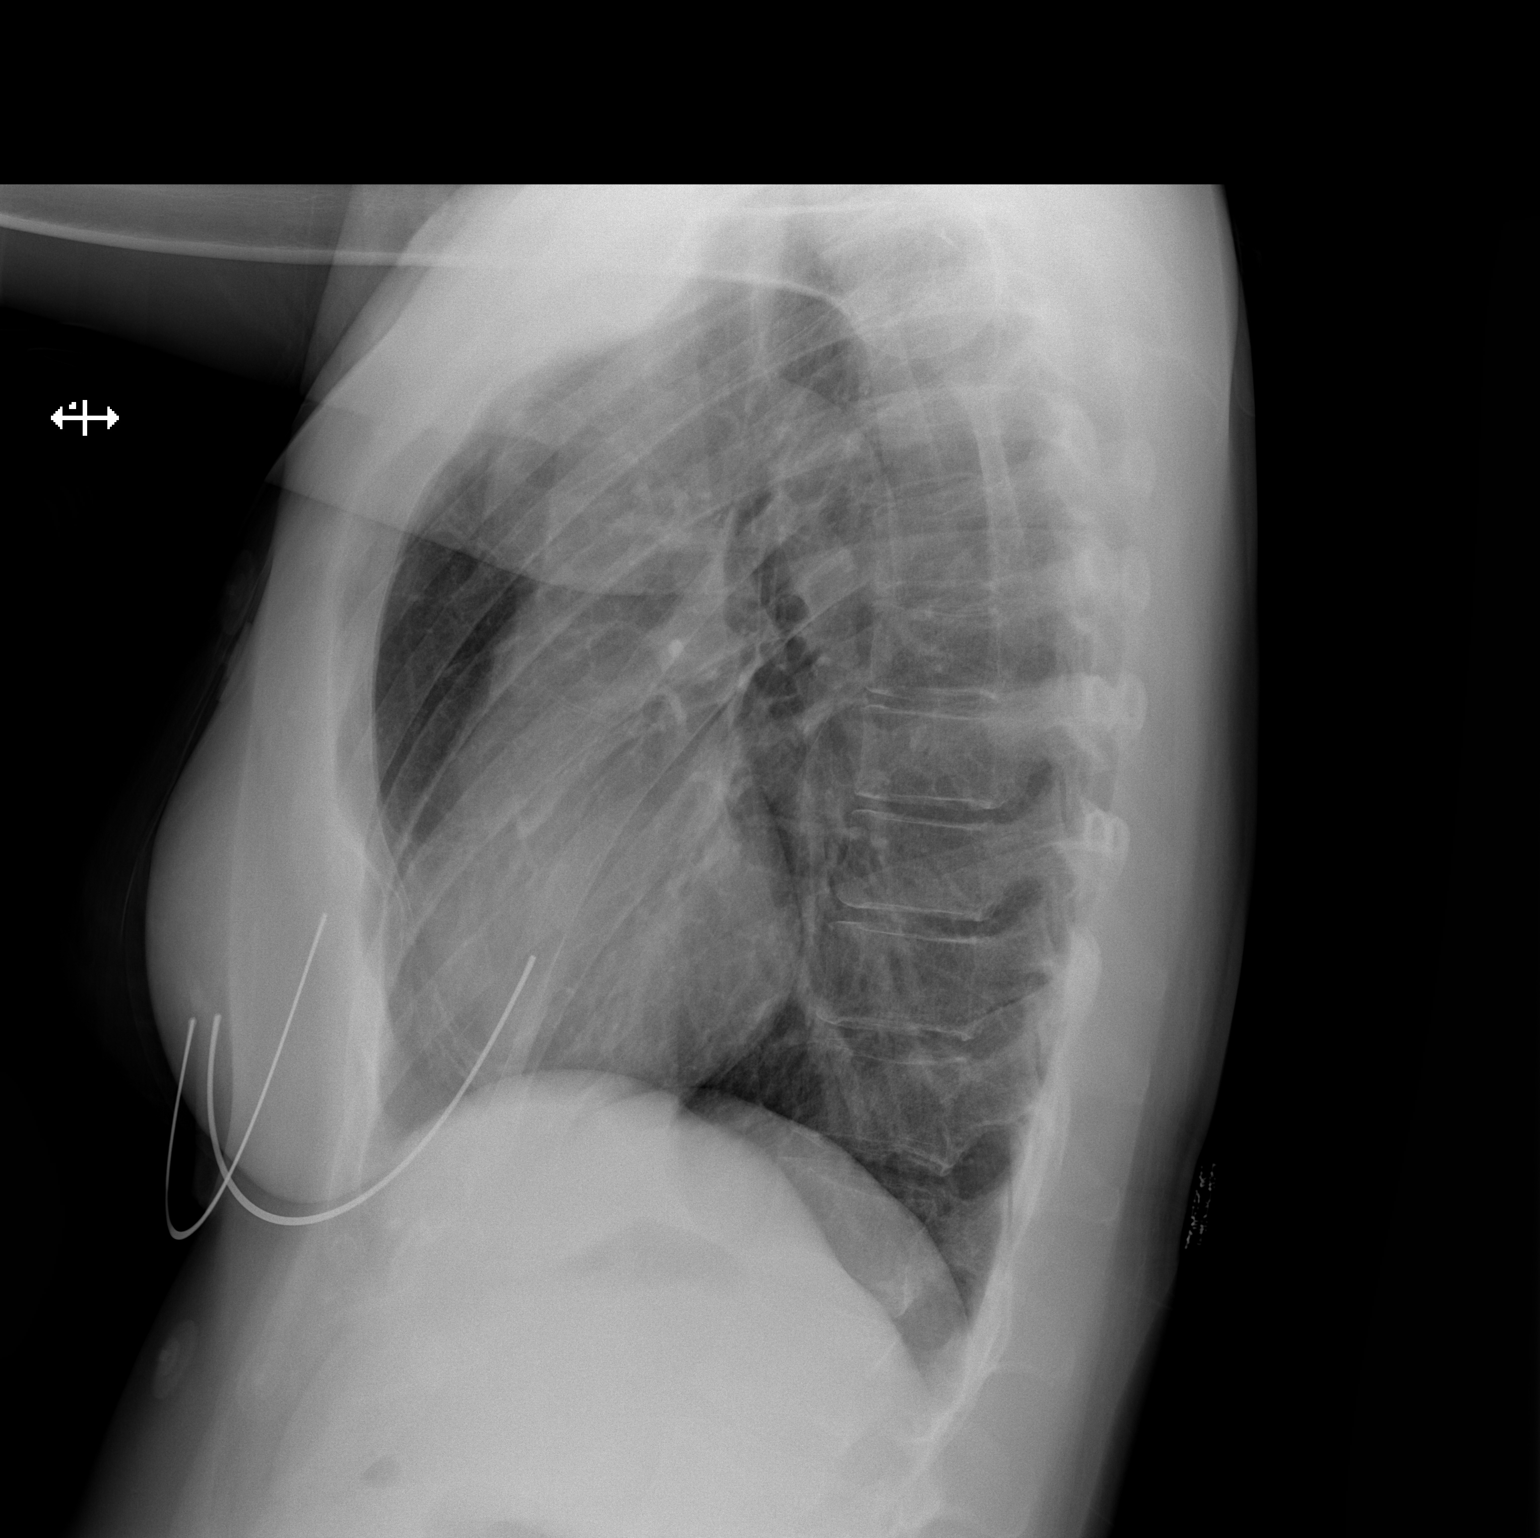

[2 of 2 positions shown; findings below may reference images not displayed]

FINDINGS: Heart size is normal. Mediastinal shadows are normal. The lungs are
clear. No bronchial thickening. No infiltrate, mass, effusion or
collapse. Pulmonary vascularity is normal. No bony abnormality.
Overlying artifact.
IMPRESSION: Normal chest

## 2023-05-13 ENCOUNTER — Other Ambulatory Visit: Payer: Self-pay | Admitting: Obstetrics and Gynecology

## 2023-05-13 DIAGNOSIS — Z1231 Encounter for screening mammogram for malignant neoplasm of breast: Secondary | ICD-10-CM

## 2023-06-09 ENCOUNTER — Ambulatory Visit (INDEPENDENT_AMBULATORY_CARE_PROVIDER_SITE_OTHER)

## 2023-06-09 ENCOUNTER — Other Ambulatory Visit: Payer: Self-pay

## 2023-06-09 ENCOUNTER — Ambulatory Visit
Admission: RE | Admit: 2023-06-09 | Discharge: 2023-06-09 | Disposition: A | Discharge status: Home / Self Care | Source: Ambulatory Visit | Attending: Family Medicine | Admitting: Family Medicine

## 2023-06-09 VITALS — BP 114/71 | HR 76 | Temp 98.6°F | Resp 16

## 2023-06-09 DIAGNOSIS — S92502A Displaced unspecified fracture of left lesser toe(s), initial encounter for closed fracture: Secondary | ICD-10-CM | POA: Diagnosis not present

## 2023-06-09 DIAGNOSIS — S99922A Unspecified injury of left foot, initial encounter: Secondary | ICD-10-CM

## 2023-06-09 DIAGNOSIS — S92512A Displaced fracture of proximal phalanx of left lesser toe(s), initial encounter for closed fracture: Secondary | ICD-10-CM | POA: Diagnosis not present

## 2023-06-09 MED ORDER — CELECOXIB 200 MG PO CAPS: 200.0000 mg | ORAL_CAPSULE | Freq: Every day | ORAL | 0 refills | Status: AC

## 2023-06-09 MED ORDER — OXYCODONE-ACETAMINOPHEN 5-325 MG PO TABS: 1.0000 | ORAL_TABLET | Freq: Three times a day (TID) | ORAL | 0 refills | Status: AC | PRN

## 2023-06-09 NOTE — ED Provider Notes (Signed)
 [facility]    CSN: [id number] Arrival date & time: 06/09/23  1103      History   Chief Complaint Chief Complaint  Patient presents with   Toe Injury    Left pinky toe reinjury - Entered by patient    HPI Sheila Schwartz is a 43 y.o. female.   HPI 43 year old female presents with left fifth toe injury reports hitting her door frame last night.  Patient reports fracturing this same toe 6 years ago and has upcoming appointment with orthopedics.  PMH significant for obesity and headache.  Past Medical History:  Diagnosis Date   Clomid pregnancy    2nd pregnancy   H/O macrosomia in infant in prior pregnancy, currently pregnant 12/09/2011   Headache(784.0)     Patient Active Problem List   Diagnosis Date Noted   LGA (large for gestational age) infant 01/16/2012   Vaginal delivery 01/16/2012   Yeast infection 12/21/2011   BV (bacterial vaginosis) 12/21/2011   H/O macrosomia in infant in prior pregnancy, currently pregnant 12/09/2011   Rapid first stage of labor 09/25/2011   Hx of infertility 09/25/2011    Past Surgical History:  Procedure Laterality Date   DILATION AND CURETTAGE OF UTERUS     WISDOM TOOTH EXTRACTION      OB History     Gravida  4   Para  3   Term  3   Preterm      AB  1   Living  3      SAB  1   IAB      Ectopic      Multiple      Live Births  3            Home Medications    Prior to Admission medications   Medication Sig Start Date End Date Taking? Authorizing Provider  celecoxib (CELEBREX) 200 MG capsule Take 1 capsule (200 mg total) by mouth daily for 15 days. 06/09/23 06/24/23 Yes Trevor Iha, FNP  oxyCODONE-acetaminophen (PERCOCET/ROXICET) 5-325 MG tablet Take 1 tablet by mouth every 8 (eight) hours as needed for severe pain (pain score 7-10). 06/09/23  Yes Trevor Iha, FNP  levonorgestrel (MIRENA) 20 MCG/24HR IUD 1 each by Intrauterine route once.    [provider]  norethindrone (ORTHO  MICRONOR) 0.35 MG tablet Take 1 tablet (0.35 mg total) by mouth daily. Patient not taking: Reported on 07/28/2018 04/25/12   Sanda Klein, CNM    Family History Family History  Problem Relation Age of Onset   Anemia Maternal Aunt    Hypertension Father    Thyroid disease Father    Cancer Maternal Grandmother    Lung cancer Paternal Grandmother    Heart disease Paternal Grandfather    Migraines Paternal Grandfather     Social History Social History   Tobacco Use   Smoking status: Never   Smokeless tobacco: Never  Substance Use Topics   Alcohol use: No   Drug use: No     Allergies   Patient has no known allergies.   Review of Systems Review of Systems  Musculoskeletal:        Left fifth toe pain secondary to injury     Physical Exam Triage Vital Signs ED Triage Vitals  Encounter Vitals Group     BP      Systolic BP Percentile      Diastolic BP Percentile      Pulse      Resp  Temp      Temp src      SpO2      Weight      Height      Head Circumference      Peak Flow      Pain Score      Pain Loc      Pain Education      Exclude from Growth Chart    No data found.  Updated Vital Signs BP 114/71   Pulse 76   Temp 98.6 F (37 C)   Resp 16   SpO2 98%     Physical Exam Vitals and nursing note reviewed.  Constitutional:      Appearance: Normal appearance. She is obese.  HENT:     Head: Normocephalic and atraumatic.     Mouth/Throat:     Mouth: Mucous membranes are moist.     Pharynx: Oropharynx is clear.  Eyes:     Extraocular Movements: Extraocular movements intact.     Conjunctiva/sclera: Conjunctivae normal.     Pupils: Pupils are equal, round, and reactive to light.  Cardiovascular:     Rate and Rhythm: Normal rate and regular rhythm.     Pulses: Normal pulses.     Heart sounds: Normal heart sounds.  Pulmonary:     Effort: Pulmonary effort is normal.     Breath sounds: Normal breath sounds. No wheezing, rhonchi or rales.   Musculoskeletal:     Cervical back: Normal range of motion and neck supple.  Skin:    General: Skin is warm and dry.  Neurological:     General: No focal deficit present.     Mental Status: She is alert and oriented to person, place, and time. Mental status is at baseline.  Psychiatric:        Mood and Affect: Mood normal.        Behavior: Behavior normal.      UC Treatments / Results  Labs (all labs ordered are listed, but only abnormal results are displayed) Labs Reviewed - No data to display  EKG   Radiology DG Foot Complete Left Result Date: 06/09/2023 CLINICAL DATA:  5th toe injury last night. EXAM: LEFT FOOT - COMPLETE 3+ VIEW COMPARISON:  None Available. FINDINGS: Oblique fracture identified through the mid shaft of the pinky toe proximal phalanx. No extension to an articular surface. IMPRESSION: Short oblique fracture through the mid shaft of the pinky toe proximal phalanx. Electronically Signed   By: Kennith Center M.D.   On: 06/09/2023 12:06    Procedures Procedures (including critical care time)  Medications Ordered in UC Medications - No data to display  Initial Impression / Assessment and Plan / UC Course  I have reviewed the triage vital signs and the nursing notes.  Pertinent labs & imaging results that were available during my care of the patient were reviewed by me and considered in my medical decision making (see chart for details).     MDM: 1.  Closed fracture of phalanx of left fifth toe, initial encounter-left fifth toe x-ray results revealed above, Rx'd Percocet 5/325 mg tablet: Take 1 tablet every 8 hours as needed for severe pain, patient advised to wear left foot postop shoe 24/7 until being evaluated by [facility]; 2.  Toe injury, left, initial encounter-left fifth toe x-ray results revealed above, Rx'd Celebrex 200 mg capsule: Take 1 capsule daily x 15 days. Patient advised of left fifth toe fracture.  Advised patient to take medication  as directed with food to completion.  Advised may take Percocet daily or as needed for severe/acute pain of left fifth toe. Patient advised to wear left foot postop shoe 24/7 until being evaluated by [facility] Encouraged to increase daily water intake to 64 ounces per day while taking these medications.  Patient is scheduled to follow-up with [facility] on Friday, 06/16/2023 for fracture management.  Patient discharged home, hemodynamically stable. Final Clinical Impressions(s) / UC Diagnoses   Final diagnoses:  Closed fracture of phalanx of left fifth toe, initial encounter  Toe injury, left, initial encounter     Discharge Instructions      Patient advised of left fifth toe fracture.  Advised patient to take medication as directed with food to completion.  Advised may take Percocet daily or as needed for severe/acute pain of left fifth toe. Patient advised to wear left foot postop shoe 24/7 until being evaluated by [facility] Encouraged to increase daily water intake to 64 ounces per day while taking these medications.  Patient is scheduled to follow-up with  orthopedics on Friday, 06/16/2023 for fracture management.     ED Prescriptions     Medication Sig Dispense Auth. Provider   celecoxib (CELEBREX) 200 MG capsule Take 1 capsule (200 mg total) by mouth daily for 15 days. 15 capsule Trevor Iha, FNP   oxyCODONE-acetaminophen (PERCOCET/ROXICET) 5-325 MG tablet Take 1 tablet by mouth every 8 (eight) hours as needed for severe pain (pain score 7-10). 24 tablet Trevor Iha, FNP      I have reviewed the PDMP during this encounter.   Trevor Iha, FNP 06/09/23 1235

## 2023-06-09 NOTE — Discharge Instructions (Addendum)
 Patient advised of left fifth toe fracture.  Advised patient to take medication as directed with food to completion.  Advised may take Percocet daily or as needed for severe/acute pain of left fifth toe. Patient advised to wear left foot postop shoe 24/7 until being evaluated by [facility] Encouraged to increase daily water intake to 64 ounces per day while taking these medications.  Patient is scheduled to follow-up with [facility] on Friday, 06/16/2023 for fracture management.

## 2023-06-09 NOTE — ED Triage Notes (Signed)
 Pt presents to [facility] with co of left pinky toe injury last night when hitting her toe on a door frame. Pt reports she fractured this toe 6 yers ago and has had issues with pain since then. Upcoming appointment with [facility].

## 2023-06-14 ENCOUNTER — Encounter: Payer: Self-pay | Admitting: Orthopedic Surgery

## 2023-06-14 ENCOUNTER — Ambulatory Visit: Admitting: Orthopedic Surgery

## 2023-06-14 DIAGNOSIS — M79675 Pain in left toe(s): Secondary | ICD-10-CM

## 2023-06-14 DIAGNOSIS — G8929 Other chronic pain: Secondary | ICD-10-CM

## 2023-06-14 NOTE — Progress Notes (Signed)
 Office Visit Note   Patient: Sheila Schwartz           Date of Birth: 06/07/1980           MRN: [id number] Visit Date: 06/14/2023 Requested by: Sheila Coho, MD [address] [place],  [place] [address] PCP: Sheila Coho, MD  Subjective: Chief Complaint  Patient presents with   Other    Left pinky toe injury on 06/08/23    HPI: Sheila Schwartz is a 43 y.o. female who presents to the office reporting left toe pain.  She hit her foot on a door frame 6 days ago.  Had prior similar injury in terms of toe fracture 6 years ago.  Those radiographs are not available.  Patient states that the left small toe fracture "did not heal right".  She has been full weightbearing with a shoe.  Is tender to touch.  Takes Celebrex with some relief.  Not a smoker.  She is going to [place] in April and a big hiking trip in June.  States that shoes do not really fit correctly since the first injury 6 years ago and that has only been exacerbated now by this most recent injury..                ROS: All systems reviewed are negative as they relate to the chief complaint within the history of present illness.  Patient denies fevers or chills.  Assessment & Plan: Visit Diagnoses: No diagnosis found.  Plan: Impression is left toe middle phalanx fracture with some lateral angulation.  This is a complicated problem because of the prior fracture to that toe.  Pin fixation would be difficult if not impossible based on the callus formation that has been present since the last injury.  This may require open reduction internal fixation or open reduction and pinning in order to achieve the desired alignment.  Final common pathway for this problem if there are complications involved in that toe fixation would be amputation which should not really affect gait much.  Because of the complexity of this problem I would like for her to see Dr. Lajoyce Schwartz on Monday for surgical evaluation.  She has been significantly  affected by the malalignment of the toe since the original injury.  Whether or not that can be corrected now I will leave to Dr. Lajoyce Schwartz to determine.  Follow-Up Instructions: No follow-ups on file.   Orders:  No orders of the defined types were placed in this encounter.  No orders of the defined types were placed in this encounter.     Procedures: No procedures performed   Clinical Data: No additional findings.  Objective: Vital Signs: There were no vitals taken for this visit.  Physical Exam:  Constitutional: Patient appears well-developed HEENT:  Head: Normocephalic Eyes:EOM are normal Neck: Normal range of motion Cardiovascular: Normal rate Pulmonary/chest: Effort normal Neurologic: Patient is alert Skin: Skin is warm Psychiatric: Patient has normal mood and affect  Ortho Exam: Ortho exam demonstrates some swelling and tenderness in that left toe.  Does have slight lateral angulation of about 10 degrees compared to the right small toe.  Pedal pulses palpable.  Ankle dorsiflexion plantarflexion intact.  There is a little bit of swelling and focal deformity possibly from dorsal angulation at the fracture site.  This could impede shoewear.  Specialty Comments:  No specialty comments available.  Imaging: No results found.   PMFS History: Patient Active Problem List   Diagnosis Date Noted  LGA (large for gestational age) infant 01/16/2012   Vaginal delivery 01/16/2012   Yeast infection 12/21/2011   BV (bacterial vaginosis) 12/21/2011   H/O macrosomia in infant in prior pregnancy, currently pregnant 12/09/2011   Rapid first stage of labor 09/25/2011   Hx of infertility 09/25/2011   Past Medical History:  Diagnosis Date   Clomid pregnancy    2nd pregnancy   H/O macrosomia in infant in prior pregnancy, currently pregnant 12/09/2011   Headache(784.0)     Family History  Problem Relation Age of Onset   Anemia Maternal Aunt    Hypertension Father    Thyroid  disease Father    Cancer Maternal Grandmother    Lung cancer Paternal Grandmother    Heart disease Paternal Grandfather    Migraines Paternal Grandfather     Past Surgical History:  Procedure Laterality Date   DILATION AND CURETTAGE OF UTERUS     WISDOM TOOTH EXTRACTION     Social History   Occupational History   Not on file  Tobacco Use   Smoking status: Never   Smokeless tobacco: Never  Substance and Sexual Activity   Alcohol use: No   Drug use: No   Sexual activity: Yes    Birth control/protection: None    Comment: currently pregnant

## 2023-06-17 ENCOUNTER — Ambulatory Visit: Admitting: Orthopedic Surgery

## 2023-06-17 DIAGNOSIS — S92502A Displaced unspecified fracture of left lesser toe(s), initial encounter for closed fracture: Secondary | ICD-10-CM | POA: Diagnosis not present

## 2023-06-18 ENCOUNTER — Encounter: Payer: Self-pay | Admitting: Orthopedic Surgery

## 2023-06-18 NOTE — Progress Notes (Signed)
 Office Visit Note   Patient: Sheila Schwartz           Date of Birth: 01/31/1981           MRN: [id number] Visit Date: 06/17/2023              Requested by: Osborn Coho, MD [address] [place],  [place] [address] PCP: Osborn Coho, MD  Chief Complaint  Patient presents with   Left Foot - Pain      HPI: Patient is a 43 year old woman who is seen for initial evaluation for left foot fifth toe proximal phalanx fracture approximately 6 weeks ago from blunt trauma striking a door frame.  Assessment & Plan: Visit Diagnoses:  1. Closed fracture of phalanx of left fifth toe, initial encounter     Plan: Recommend continue with the postoperative shoe weightbearing as tolerated advance to sneakers as she tolerates.  Follow-Up Instructions: Return if symptoms worsen or fail to improve.   Ortho Exam  Patient is alert, oriented, no adenopathy, well-dressed, normal affect, normal respiratory effort. Examination there is a palpable dorsalis pedis pulse.  There is no varus or valgus malalignment to the little toe there is swelling.  Radiographs show a nondisplaced fracture of the proximal phalanx of the left little toe.  No open wound.  Imaging: No results found. No images are attached to the encounter.  Labs: Lab Results  Component Value Date   LABORGA NO GROUP B STREP (S.AGALACTIAE) ISOLATED 01/15/2012     Lab Results  Component Value Date   ALBUMIN 3.9 07/28/2018    No results found for: "MG" No results found for: "VD25OH"  No results found for: "PREALBUMIN"    Latest Ref Rng & Units 10/28/2019    1:06 PM 07/28/2018    3:40 PM 01/17/2012    5:20 AM  CBC EXTENDED  WBC 4.0 - 10.5 K/uL 7.1  11.1  13.1   RBC 3.87 - 5.11 MIL/uL 4.87  4.26  3.46   Hemoglobin 12.0 - 15.0 g/dL 82.9  56.2  9.3   HCT 13.0 - 46.0 % 43.8  38.2  28.4   Platelets 150 - 400 K/uL 266  256  218      There is no height or weight on file to calculate BMI.  Orders:  No orders  of the defined types were placed in this encounter.  No orders of the defined types were placed in this encounter.    Procedures: No procedures performed  Clinical Data: No additional findings.  ROS:  All other systems negative, except as noted in the HPI. Review of Systems  Objective: Vital Signs: There were no vitals taken for this visit.  Specialty Comments:  No specialty comments available.  PMFS History: Patient Active Problem List   Diagnosis Date Noted   LGA (large for gestational age) infant 01/16/2012   Vaginal delivery 01/16/2012   Yeast infection 12/21/2011   BV (bacterial vaginosis) 12/21/2011   H/O macrosomia in infant in prior pregnancy, currently pregnant 12/09/2011   Rapid first stage of labor 09/25/2011   Hx of infertility 09/25/2011   Past Medical History:  Diagnosis Date   Clomid pregnancy    2nd pregnancy   H/O macrosomia in infant in prior pregnancy, currently pregnant 12/09/2011   Headache(784.0)     Family History  Problem Relation Age of Onset   Anemia Maternal Aunt    Hypertension Father    Thyroid disease Father    Cancer Maternal Grandmother  Lung cancer Paternal Grandmother    Heart disease Paternal Grandfather    Migraines Paternal Grandfather     Past Surgical History:  Procedure Laterality Date   DILATION AND CURETTAGE OF UTERUS     WISDOM TOOTH EXTRACTION     Social History   Occupational History   Not on file  Tobacco Use   Smoking status: Never   Smokeless tobacco: Never  Substance and Sexual Activity   Alcohol use: No   Drug use: No   Sexual activity: Yes    Birth control/protection: None    Comment: currently pregnant
